# Patient Record
Sex: Male | Born: 1955
Health system: Southern US, Community
[De-identification: ages and names within clinical notes are randomized; demographics above are authoritative.]

## PROBLEM LIST (undated history)

## (undated) DIAGNOSIS — N289 Disorder of kidney and ureter, unspecified: Secondary | ICD-10-CM

## (undated) DIAGNOSIS — I10 Essential (primary) hypertension: Secondary | ICD-10-CM

## (undated) DIAGNOSIS — K429 Umbilical hernia without obstruction or gangrene: Secondary | ICD-10-CM

## (undated) HISTORY — DX: Umbilical hernia without obstruction or gangrene: K42.9

---

## 1996-03-20 HISTORY — PX: COLONOSCOPY: SHX174

## 1997-04-20 HISTORY — PX: HERNIA REPAIR: SHX51

## 2002-02-05 ENCOUNTER — Emergency Department (HOSPITAL_COMMUNITY): Admission: EM | Admit: 2002-02-05 | Discharge: 2002-02-05 | Payer: Self-pay | Admitting: Emergency Medicine

## 2006-07-22 ENCOUNTER — Ambulatory Visit: Payer: Self-pay | Admitting: Family Medicine

## 2006-07-22 ENCOUNTER — Observation Stay (HOSPITAL_COMMUNITY): Admission: EM | Admit: 2006-07-22 | Discharge: 2006-07-23 | Payer: Self-pay | Admitting: Emergency Medicine

## 2009-02-15 ENCOUNTER — Encounter: Payer: Self-pay | Admitting: Gastroenterology

## 2009-04-14 ENCOUNTER — Emergency Department (HOSPITAL_COMMUNITY): Admission: EM | Admit: 2009-04-14 | Discharge: 2009-04-14 | Payer: Self-pay | Admitting: Emergency Medicine

## 2009-06-03 ENCOUNTER — Encounter (INDEPENDENT_AMBULATORY_CARE_PROVIDER_SITE_OTHER): Payer: Self-pay | Admitting: *Deleted

## 2009-07-01 ENCOUNTER — Encounter (INDEPENDENT_AMBULATORY_CARE_PROVIDER_SITE_OTHER): Payer: Self-pay | Admitting: *Deleted

## 2009-07-02 ENCOUNTER — Ambulatory Visit: Payer: Self-pay | Admitting: Gastroenterology

## 2009-07-08 ENCOUNTER — Telehealth: Payer: Self-pay | Admitting: Gastroenterology

## 2010-05-20 NOTE — Progress Notes (Signed)
Summary: cx proc for tomorrow  Phone Note Call from Patient Call back at Home Phone [phone]   Caller: Spouse Call For: Arlyce Dice Reason for Call: Talk to Nurse Summary of Call: Patient cancelled his procedure for tomorrow because his dad was put in the ICU today and he is there with his father, wife states that he will call and reschedule that appt for a later time.   Are you going to charge?  Initial call taken by: Tawni Levy,  July 08, 2009 3:30 PM  Follow-up for Phone Call        no Follow-up by: Louis Meckel MD,  July 08, 2009 4:35 PM     Appended Document: cx proc for tomorrow Patient NOT BILLED.

## 2010-05-20 NOTE — Letter (Signed)
Summary: [facility] Instructions  [facility]  [address] [place], [place] [address]   Phone: [phone]  Fax: [phone]       AZEL GUMINA    1955/06/08    MRN: [id number]        Procedure Day Dorna Bloom:  Jake Shark  07/09/09     Arrival Time:  10:30AM     Procedure Time:  11:30AM     Location of Procedure:                    _ X_  [facility] (4th Floor)                        PREPARATION FOR COLONOSCOPY WITH MOVIPREP   Starting 5 days prior to your procedure 07/04/09 do not eat nuts, seeds, popcorn, corn, beans, peas,  salads, or any raw vegetables.  Do not take any fiber supplements (e.g. Metamucil, Citrucel, and Benefiber).  THE DAY BEFORE YOUR PROCEDURE         DATE: 07/08/09  DAY: MONDAY  1.  Drink clear liquids the entire day-NO SOLID FOOD  2.  Do not drink anything colored red or purple.  Avoid juices with pulp.  No orange juice.  3.  Drink at least 64 oz. (8 glasses) of fluid/clear liquids during the day to prevent dehydration and help the prep work efficiently.  CLEAR LIQUIDS INCLUDE: Water Jello Ice Popsicles Tea (sugar ok, no milk/cream) Powdered fruit flavored drinks Coffee (sugar ok, no milk/cream) Gatorade Juice: apple, white grape, white cranberry  Lemonade Clear bullion, consomm, broth Carbonated beverages (any kind) Strained chicken noodle soup Hard Candy                             4.  In the morning, mix first dose of MoviPrep solution:    Empty 1 Pouch A and 1 Pouch B into the disposable container    Add lukewarm drinking water to the top line of the container. Mix to dissolve    Refrigerate (mixed solution should be used within 24 hrs)  5.  Begin drinking the prep at 5:00 p.m. The MoviPrep container is divided by 4 marks.   Every 15 minutes drink the solution down to the next mark (approximately 8 oz) until the full liter is complete.   6.  Follow completed prep with 16 oz of clear liquid of your choice  (Nothing red or purple).  Continue to drink clear liquids until bedtime.  7.  Before going to bed, mix second dose of MoviPrep solution:    Empty 1 Pouch A and 1 Pouch B into the disposable container    Add lukewarm drinking water to the top line of the container. Mix to dissolve    Refrigerate  THE DAY OF YOUR PROCEDURE      DATE: 07/09/09  DAY: TUESDAY  Beginning at 6:30AM (5 hours before procedure):         1. Every 15 minutes, drink the solution down to the next mark (approx 8 oz) until the full liter is complete.  2. Follow completed prep with 16 oz. of clear liquid of your choice.    3. You may drink clear liquids until 9:30AM (2 HOURS BEFORE PROCEDURE).   MEDICATION INSTRUCTIONS  Unless otherwise instructed, you should take regular prescription medications with a small sip of water   as early as possible the morning  of your procedure.         OTHER INSTRUCTIONS  You will need a responsible adult at least 56 years of age to accompany you and drive you home.   This person must remain in the waiting room during your procedure.  Wear loose fitting clothing that is easily removed.  Leave jewelry and other valuables at home.  However, you may wish to bring a book to read or  an iPod/MP3 player to listen to music as you wait for your procedure to start.  Remove all body piercing jewelry and leave at home.  Total time from sign-in until discharge is approximately 2-3 hours.  You should go home directly after your procedure and rest.  You can resume normal activities the  day after your procedure.  The day of your procedure you should not:   Drive   Make legal decisions   Operate machinery   Drink alcohol   Return to work  You will receive specific instructions about eating, activities and medications before you leave.    The above instructions have been reviewed and explained to me by   Wyona Almas RN  July 02, 2009 8:57 AM     I fully understand  and can verbalize these instructions _____________________________ Date _________

## 2010-05-20 NOTE — Letter (Signed)
Summary: Previsit letter  [facility]  [address] [place], [place] [address]   Phone: [phone]  Fax: [phone]       06/03/2009 MRN: [id number]  Phillip Mccall [address], [place]  [address]  Dear Phillip Mccall,  Welcome to the Gastroenterology Division at [facility].    You are scheduled to see a nurse for your pre-procedure visit on 07/02/2009 at 8:30AM on the 3rd floor at [facility], [address]  We ask that you try to arrive at our office 15 minutes prior to your appointment time to allow for check-in.  Your nurse visit will consist of discussing your medical and surgical history, your immediate family medical history, and your medications.    Please bring a complete list of all your medications or, if you prefer, bring the medication bottles and we will list them.  We will need to be aware of both prescribed and over the counter drugs.  We will need to know exact dosage information as well.  If you are on blood thinners (Coumadin, Plavix, Aggrenox, Ticlid, etc.) please call our office today/prior to your appointment, as we need to consult with your physician about holding your medication.   Please be prepared to read and sign documents such as consent forms, a financial agreement, and acknowledgement forms.  If necessary, and with your consent, a friend or relative is welcome to sit-in on the nurse visit with you.  Please bring your insurance card so that we may make a copy of it.  If your insurance requires a referral to see a specialist, please bring your referral form from your primary care physician.  No co-pay is required for this nurse visit.     If you cannot keep your appointment, please call [phone] to cancel or reschedule prior to your appointment date.  This allows [place] the opportunity to schedule an appointment for another patient in need of care.    Thank you for choosing [facility] for your medical needs.   We appreciate the opportunity to care for you.  Please visit [place] at our website  to learn more about our practice.                     Sincerely.                                                                                                                   The Gastroenterology Division

## 2010-05-20 NOTE — Miscellaneous (Signed)
Summary: [facility] Previsit/prep  Clinical Lists Changes  Medications: Added new medication of MOVIPREP 100 GM  SOLR (PEG-KCL-NACL-NASULF-NA ASC-C) As per prep instructions. - Signed Rx of MOVIPREP 100 GM  SOLR (PEG-KCL-NACL-NASULF-NA ASC-C) As per prep instructions.;  #1 x 0;  Signed;  Entered by: Wyona Almas RN;  Authorized by: Louis Meckel MD;  Method used: Electronically to [facility] [address], [place], [place], [place]  [address], Ph: [phone], Fax: [phone] Allergies: Added new allergy or adverse reaction of SULFA Observations: Added new observation of NKA: F (07/02/2009 8:26)    Prescriptions: MOVIPREP 100 GM  SOLR (PEG-KCL-NACL-NASULF-NA ASC-C) As per prep instructions.  #1 x 0   Entered by:   Wyona Almas RN   Authorized by:   Louis Meckel MD   Signed by:   Wyona Almas RN on 07/02/2009   Method used:   Electronically to        [facility] (retail)       [address]       [place], [place]  [address]       Ph: [phone]       Fax: [phone]   RxID:   [id number]

## 2010-07-21 LAB — URIC ACID: Uric Acid, Serum: 7.9 mg/dL — ABNORMAL HIGH (ref 4.0–7.8)

## 2010-07-21 LAB — CBC
HCT: 42.8 % (ref 39.0–52.0)
Platelets: 266 10*3/uL (ref 150–400)
RBC: 5.04 MIL/uL (ref 4.22–5.81)
WBC: 9.5 10*3/uL (ref 4.0–10.5)

## 2010-07-21 LAB — DIFFERENTIAL
Lymphocytes Relative: 14 % (ref 12–46)
Lymphs Abs: 1.3 10*3/uL (ref 0.7–4.0)
Monocytes Relative: 6 % (ref 3–12)
Neutrophils Relative %: 78 % — ABNORMAL HIGH (ref 43–77)

## 2010-09-05 NOTE — H&P (Signed)
NAMEKARLOS, SCADDEN              ACCOUNT NO.:  [id number]   MEDICAL RECORD NO.:  [id number]          PATIENT TYPE:  INP   LOCATION:  3710                         FACILITY:  [facility]   PHYSICIAN:  Leighton Roach McDiarmid, M.D.DATE OF BIRTH:  [birth year]   DATE OF ADMISSION:  07/22/2006  DATE OF DISCHARGE:  07/23/2006                              HISTORY & PHYSICAL   PRIMARY CARE PHYSICIAN:  Dr. John Giovanni of [place], [place].   CHIEF COMPLAINT:  Chest pain.   HPI:  This is a 55 year old white male with a history of hypertension  and a former tobacco abuser.  He started this morning with little sharp  chest pains in the middle of his chest.  Later that day he had  squeezing left-sided chest pain that lasted a couple of seconds.  He  then noted left-sided facial pain that lasted 2 seconds and disoriented  him.  He then admits he became very worried and his heart rate shot up.  He even states, I think I may have had a panic attack.  He was  breathing fast for about 15 minutes and his hands went clammy.  He  denies any associated diaphoresis, nausea, vomiting, numbness, tingling,  syncope or loss of consciousness.   REVIEW OF SYSTEMS:  GENERAL:  No fever.  CARDIOVASCULAR:  No  palpitations.  Positive for chest pain.  PULMONARY:  No shortness of  breath.  No increased work of breathing.  GI:  No nausea.  No vomiting.  No diarrhea.  No reflux symptoms.  NEURO:  No seizure.  No loss of  consciousness.  No confusion.  No altered mental status.  No numbness or  tingling.   PAST MEDICAL HISTORY:  The patient has a history of:  1. Hypertension, but is on no medicines.  2. He has a history of a kidney stone, as well as a right inguinal      hernia repair.  3. He also reports internal hemorrhoids.   MEDICINES:  Aspirin 81 mg p.o. daily.   ALLERGIES:  NONE.   SOCIAL HISTORY:  The patient is married.  He is the [address] [employer]  in [place] and he also works on a farm  part-time.  He has  smoked off and on for many years and quit on New Year's Day, January of  2008.  He admits to an occasional glass of wine, but denies any illicit  drugs.   FAMILY HISTORY:  His grandfather died from a myocardial infarction.  His  father has hypertension, but no known coronary artery disease.  His  mother has Parkinson and his daughter has type 1 diabetes.   VITAL SIGNS:  Temperature 98.7.  Blood pressure 157/92.  Heart rate 120.  Respirations 20, 97% on room air.  GENERAL:  No acute distress and cooperative.  HEENT:  Pupils are equal, round and reactive to light.  Sclerae are  anicteric.  Mucous membranes are moist and pharynx is nonerythematous.  CARDIOVASCULAR:  Tachycardic with regular rate and rhythm.  No murmur  and no JVD.  LUNGS:  Normal effort.  Clear  to auscultation bilaterally.  No wheezes  or crackles.  ABDOMEN:  Obese.  Active bowel sounds.  Soft, nontender, nondistended.  No masses.  EXTREMITIES:  No edema.  CHEST:  There is no tenderness to palpation at the costal rib margins.   STUDIES:  A chest x-ray shows no acute disease with questionable COPD  versus emphysema.  EKG shows sinus tachycardia with no acute ST segment  or T wave changes.   LABS:  D-dimer is less than 0.22.  Sodium is 139, potassium 4.4,  chloride 108, bicarb 23.8, creatinine 1.1, glucose 118.  Initial  troponin is less than 0.05.   ASSESSMENT:  1. This is a 55 year old white male with atypical chest pain.  The      chest pain is likely noncardiac; however, he does have a number of      cardiac risk factors including male gender, age greater than 15,      history of tobacco use and hypertension.  We will obtain 3 sets of      cardiac enzymes in order to rule out acute myocardial infarction,      as well as obtain another EKG in the morning to assure acute      findings.  We will also obtain a fasting lipid panel in the morning      to assess for any underlying hyperlipidemia.   We will start a      proton pump inhibitor that ensure that his chest pain is not      secondary to reflux disease.  There is no evidence for      costochondritis on exam.  We will prescribe Ativan as needed for      anxiety as there is definitely an underlying anxiety component      related to his chest pain.  We will hold off for now on calling      cardiology, unless the clinical suspicion for myocardial infarction      increases.  2. Hypertension:  The patient has a history of hypertension, but is on      no oral meds.  We will start metoprolol 25 mg p.o. b.i.d. given his      increased heart rate on exam.     ______________________________  Sylvan Cheese, M.D.    ______________________________  Leighton Roach McDiarmid, M.D.    MJ/MEDQ  D:  07/23/2006  T:  07/24/2006  Job:  [id number]

## 2010-09-05 NOTE — Discharge Summary (Signed)
NAMEBRANDAN, Phillip Mccall NO.:  [id number]   MEDICAL RECORD NO.:  [id number]          PATIENT TYPE:  INP   LOCATION:  3710                         FACILITY:  [facility]   PHYSICIAN:  Sylvan Cheese, M.D.       DATE OF BIRTH:  06-25-1955   DATE OF ADMISSION:  07/22/2006  DATE OF DISCHARGE:  07/23/2006                               DISCHARGE SUMMARY   PRIMARY CARE PHYSICIAN:  Dr. John Giovanni in [place], [facility].   DISCHARGE DIAGNOSES:  1. Atypical chest pain.  2. Anxiety disorder.  3. Hypertension.  4. Hyperlipidemia.  5. History of former tobacco abuse.   CONSULTS:  None.   PROCEDURES:  1. A chest x-ray was obtained that showed no acute disease and was      questionable for underlying emphysema versus COPD.  2. An EKG was also performed that initially showed sinus tachycardia      with no acute ST segment or T wave changes.   DISCHARGE MEDICATIONS:  1. Metoprolol 12.5 mg p.o. b.i.d.  2. Zocor 40 mg p.o. each night.  3. Aspirin 81 mg p.o. daily.   FOLLOWUP INSTRUCTIONS:  The patient is to make an followup appointment  with Dr. Sudie Bailey within the next week for followup.  The patient's wife  will also establish care with a primary cardiologist in the area, which  is to be determined.   HOSPITAL COURSE:  Briefly, Phillip Mccall is a 55 year old white male with  a known history of hypertension, but on no medications.  He presented to  the emergency department with some left squeezing chest pain earlier in  the day that lasted approximately 2 minutes and was resolved by his  presentation to the emergency department.  Given his multiple risk  factors, including smoking, hypertension, male gender, age greater than  4 and history of tobacco abuse, it was pertinent to bring him in to  rule out cardiac causes of chest pain.  An EKG, on admission, showed no  acute changes.  A chest x-ray was negative.  He was ruled out with  cardiac markers x3 every 8 hours.   Of note, a fasting lipid panel was  significant for an HDL of 35 and an LDL of 172.  Therefore, he does have  some significant hyperlipidemia, as his goal LDL is 130.  He was started  on metoprolol for an increased heart rate, as well as elevated blood  pressures.  He was also started on Zocor this admission and is agreeable  to lipid therapy in the future.  The risks of metoprolol and Zocor were  discussed with the patient at length.  At the time of discharge, he  still complained of some vague, brief left-sided chest pain that lasted  a couple of seconds, which he currently attributes to gastroesophageal  reflux.  The patient, on the day of discharge, did not desire evaluation  by a cardiologist and was anxious to leave the hospital.  It was  reinforced to the patient that a cardiac cause of his chest discomfort  could not be ruled out without thorough  evaluation by a cardiologist.  He understands these risks and was discharged from the  hospital.  There is a high suspicion that his chest pain, however, is  probably due to some underlying anxiety regarding coronary artery  disease in his family members.  He does endorse that he does have  anxiety, that heart rate goes up when he feels his chest discomfort.           ______________________________  Sylvan Cheese, M.D.     MJ/MEDQ  D:  07/23/2006  T:  07/24/2006  Job:  [id number]   cc:   Mila Homer. Sudie Bailey, M.D.

## 2014-08-17 ENCOUNTER — Emergency Department (HOSPITAL_COMMUNITY)
Admission: EM | Admit: 2014-08-17 | Discharge: 2014-08-17 | Disposition: A | Discharge status: Home / Self Care | Payer: 59 | Attending: Emergency Medicine | Admitting: Emergency Medicine

## 2014-08-17 ENCOUNTER — Emergency Department (HOSPITAL_COMMUNITY): Payer: 59

## 2014-08-17 ENCOUNTER — Encounter (HOSPITAL_COMMUNITY): Payer: Self-pay | Admitting: *Deleted

## 2014-08-17 DIAGNOSIS — S39012A Strain of muscle, fascia and tendon of lower back, initial encounter: Secondary | ICD-10-CM | POA: Insufficient documentation

## 2014-08-17 DIAGNOSIS — Y92410 Unspecified street and highway as the place of occurrence of the external cause: Secondary | ICD-10-CM | POA: Insufficient documentation

## 2014-08-17 DIAGNOSIS — Z87442 Personal history of urinary calculi: Secondary | ICD-10-CM | POA: Insufficient documentation

## 2014-08-17 DIAGNOSIS — S161XXA Strain of muscle, fascia and tendon at neck level, initial encounter: Secondary | ICD-10-CM | POA: Insufficient documentation

## 2014-08-17 DIAGNOSIS — I10 Essential (primary) hypertension: Secondary | ICD-10-CM | POA: Insufficient documentation

## 2014-08-17 DIAGNOSIS — Y998 Other external cause status: Secondary | ICD-10-CM | POA: Insufficient documentation

## 2014-08-17 DIAGNOSIS — Y9389 Activity, other specified: Secondary | ICD-10-CM | POA: Insufficient documentation

## 2014-08-17 DIAGNOSIS — Z87448 Personal history of other diseases of urinary system: Secondary | ICD-10-CM | POA: Insufficient documentation

## 2014-08-17 DIAGNOSIS — T148XXA Other injury of unspecified body region, initial encounter: Secondary | ICD-10-CM

## 2014-08-17 HISTORY — DX: Disorder of kidney and ureter, unspecified: N28.9

## 2014-08-17 HISTORY — DX: Essential (primary) hypertension: I10

## 2014-08-17 MED ORDER — HYDROCODONE-ACETAMINOPHEN 5-325 MG PO TABS: ORAL_TABLET | ORAL | Status: DC

## 2014-08-17 MED ORDER — METHOCARBAMOL 500 MG PO TABS: 1000.0000 mg | ORAL_TABLET | Freq: Four times a day (QID) | ORAL | Status: DC | PRN

## 2014-08-17 MED ORDER — ACETAMINOPHEN 500 MG PO TABS
1000.0000 mg | ORAL_TABLET | Freq: Once | ORAL | Status: DC
  Filled 2014-08-17: qty 2

## 2014-08-17 MED ORDER — IBUPROFEN 400 MG PO TABS
400.0000 mg | ORAL_TABLET | Freq: Once | ORAL | Status: DC
  Filled 2014-08-17: qty 1
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
## 2014-08-17 NOTE — ED Notes (Signed)
Patient stated that his increase pulse rate was due to b/p cuff.  Cuff removed and pulse rate dropped to 107

## 2014-08-17 NOTE — Discharge Instructions (Signed)
°Emergency Department Resource Guide °1) Find a Doctor and Pay Out of Pocket °Although you won't have to find out who is covered by your insurance plan, it is a good idea to ask around and get recommendations. You will then need to call the office and see if the doctor you have chosen will accept you as a new patient and what types of options they offer for patients who are self-pay. Some doctors offer discounts or will set up payment plans for their patients who do not have insurance, but you will need to ask so you aren't surprised when you get to your appointment. ° °2) Contact Your Local Health Department °Not all health departments have doctors that can see patients for sick visits, but many do, so it is worth a call to see if yours does. If you don't know where your local health department is, you can check in your phone book. The CDC also has a tool to help you locate your state's health department, and many state websites also have listings of all of their local health departments. ° °3) Find a Walk-in Clinic °If your illness is not likely to be very severe or complicated, you may want to try a walk in clinic. These are popping up all over the country in pharmacies, drugstores, and shopping centers. They're usually staffed by nurse practitioners or physician assistants that have been trained to treat common illnesses and complaints. They're usually fairly quick and inexpensive. However, if you have serious medical issues or chronic medical problems, these are probably not your best option. ° °No Primary Care Doctor: °- Call [facility] at  832-8000 - they can help you locate a primary care doctor that  accepts your insurance, provides certain services, etc. °- Physician Referral Service- 1-800-533-3463 ° °Chronic Pain Problems: °Organization         Address  Phone   Notes  °[facility]  ([phone]) [phone] Patients need to be referred by their primary care doctor.  ° °Medication  Assistance: °Organization         Address  Phone   Notes  °[place] [facility] [address] °[place], [place] [address] (336) 641-8030 --Must be a resident of [place] °-- Must have NO insurance coverage whatsoever (no Medicaid/ Medicare, etc.) °-- The pt. MUST have a primary care doctor that directs their care regularly and follows them in the community °  °[facility]  (866) 331-1348   °[employer]  (888) 892-1162   ° °Agencies that provide inexpensive medical care: °Organization         Address  Phone   Notes  °[facility]  (336) 832-8035   °[facility]    (336) 832-7272   °[facility] [address] °[place], [place] [address] (336) 832-4777   °[facility] [address], °[place] (336) 271-4999   °[facility]    (336) 373-0678   °[facility]    (336) 272-1050   °[facility] ° [address], [place] Phone:  (336) 832-4444, Fax:  (336) 832-4440 Hours of Operation:  9 am - 6 pm, M-F.  Also accepts Medicaid/Medicare and self-pay.  °[facility] ° [address], [place] Phone: (336) [phone], Fax: (336) [phone]. Hours of Operation:  8:30 am - 5:30 pm, M-F.  Also accepts Medicaid and self-pay.  °[facility] [place] [address], [place] Phone: (336) 878-6027   °[facility] [address], [place], [place] (336)723-1848, Ext. 123 Mondays & Thursdays: 7-9 AM.  First 15 patients are seen on a first come, first serve basis. °  ° °Medicaid-accepting [place] Providers: ° °Organization         Address  Phone   Notes  °[facility] [address], [place] (336) 641-2100 Also accepts self-pay patients.  °[facility] [address], [place] ° (336) 856-9996   °[facility] [address], [place]  (336) 288-8857   °[facility] [address] [place] [address], [place] (336) 299-7000   °Veita Bland [address], [place]  ° (336) 373-1557 Only accepts [place] Medicaid patients after they have their name applied to their card.  ° °Self-Pay (no insurance) in [place]: ° °Organization         Address  Phone   Notes  °Sickle Cell Patients, [facility] [address], [place] (336) 832-1970   °[facility] [address], [place] (336) 832-4400   °[facility] ° [address], [place] (336) 992-4800   °[facility]/Dr. Osei-Bonsu ° [address] [place] [address], [place] or [address], [place] (336) [phone] Phone number for both [place] and [place] locations is the same.  °[facility] [address], [place] (336) 299-0000   °[facility] [address] [place] [address], [place] or [address] (336) 852-7530 °(336) 878-2260   °[facility] [address], [place] (336) 350-1642, phone; (336) 294-5005, fax Sees patients 1st and 3rd Saturday of every month.  Must not qualify for public or private insurance (i.e. Medicaid, Medicare, [place] Health Choice, Veterans' Benefits) • Household income should be no more than 200% of the poverty level •The clinic cannot treat you if you are pregnant or think you are pregnant • Sexually transmitted diseases are not treated at the clinic.  ° ° °Dental Care: °Organization         Address  Phone  Notes  °[place] [facility] [address],  (336) 641-6152 Accepts children up to age 21 who are enrolled in Medicaid or [place] Health Choice; pregnant women with a Medicaid card; and children who have applied for Medicaid or [place] Health Choice, but were declined, whose parents can pay a reduced fee at time of service.  °[place]  [facility] [place]  [address], [place] (336) 641-7733 Accepts children up to age 21 who are enrolled in Medicaid or [place] Health Choice; pregnant women with a Medicaid card; and children who have applied for Medicaid or [place] Health Choice, but were declined, whose parents can pay a reduced fee at time of service.  °[facility] ° [address],  (336) 641-4533 Patients are seen by appointment only. Walk-ins are not accepted. [facility] will see patients 18 years of age and older. °Monday - Tuesday (8am-5pm) °Most Wednesdays (8:30-5pm) °$30 per visit, cash only  °[facility] ° [address], [place] (336) 641-4533 Patients are seen by appointment only. Walk-ins are not accepted. [facility] will see patients 18 years of age and older. °One   Wednesday Evening (Monthly: Volunteer Based).  $30 per visit, cash only  °[facility]  (919) 537-3737 for adults; Children under age 4, call Graduate Pediatric Dentistry at ([phone]) [phone]. Children aged 4-14, please call ([phone]) [phone] to request a pediatric application. ° Dental services are provided in all areas of dental care including fillings, crowns and bridges, complete and partial dentures, implants, gum treatment, root canals, and extractions. Preventive care is also provided. Treatment is provided to both adults and children. °Patients are selected via a lottery and there is often a waiting list. °  °[facility] [address], °[place] ° (336) 763-8833 [email or web address] °  °[facility] [address], [place], [place] (336)723-1848, Ext. 123 Second and Fourth Thursday of each month, opens at 6:30 AM; Clinic ends at 9 AM.  Patients are seen on a first-come first-served basis, and a limited number are seen during each clinic.  ° °[facility] ° [address], [place], [place] (336) [phone]    Eligibility Requirements °You must have lived in [place], [place], or [place] counties for at least the last three months. °  You cannot be eligible for state or federal sponsored healthcare insurance, including Veterans Administration, Medicaid, or Medicare. °  You generally cannot be eligible for healthcare insurance through your employer.  °  How to apply: °Eligibility screenings are held every Tuesday and Wednesday afternoon from 1:00 pm until 4:00 pm. You do not need an appointment for the interview!  °[facility] [address], [place], [place] 336-631-2330   °[facility]  336-342-8273   °[facility]  336-703-3100   °[facility]  336-570-6415   ° °Behavioral Health Resources in the Community: °Intensive Outpatient Programs °Organization         Address  Phone  Notes  °[facility] [address], [place], [place] 336-878-6098   °[facility] [address], [place], [place] 336-832-9800   °[facility] [address], [place], [place] ° 336-882-2125   °[facility] [address],  °[place], [place] 1-800-853-5163 or 336-641-4981   °Substance Abuse Resources °Organization         Address  Phone  Notes  °[facility]  336-882-2125   °[facility]  336-784-9470   °[facility]  336-285-9073   °[facility]  336-845-3988   °Residential & Outpatient Substance Abuse Program  1-800-659-3381   °Psychological Services °Organization         Address  Phone  Notes  °[facility]  336- 832-9600   °[facility]  336- 378-7881   °[facility] [address], [place] 1-800-853-5163 or 336-641-4981   ° °Mobile Crisis Teams °Organization         Address  Phone  Notes  °[facility], Mobile Crisis Care Unit  1-877-626-1772   °Assertive °[facility] ° [address]  [place], [place] 336-834-9664   °Sharon DeEsch [address] °[place] 336-554-5454   ° °Self-Help/Support Groups °Organization         Address  Phone             Notes  °[facility]  - variety of support groups  336- 373-1402 Call for more information  °Narcotics Anonymous (NA), [facility] [address], °[place]  2 meetings at this location  ° °  Residential Treatment Programs Organization         Address  Phone  Notes  [facility] [address],    [place]  1-8577614854   [facility]  [address] [place] [address], [place], [place]   [place], [place] 320-614-6729 Admissions: 8am-3pm M-F  [facility] [place] [address],    [place], [place] 409-735-3299   [facility] [address] [place], [place], [place]   [facility] [address],  [place], [place]   [facility] Intensive Outpatient [address], [place], [place]   [facility] [address]  [place], [place] 1-681-038-0179 or (304) 532-8645   [facility] [address] [place], [place] Accepts Medicaid  [facility] [address]  [place] 1-845-151-4532 Substance Abuse/Addiction Treatment   [facility]         Address  Phone  Notes  [facility]  2043886244   Domenic Schwab, PhD [address] [place], [place]   (914)736-7845 or 424-647-1198   [place], [place] 514-415-9623   [facility] [address] [place], [place] (563) [phone] Insurance/Medicaid/sponsorship through [facility] and Families [address]                                    [place], [place] [phone])[phone] [place] [address], [place] [phone]    Dr. Adele Schilder  [phone]   [facility] Dept. 1) [address] [place] 2) [place] 3)  [place] [address], [place] [phone]  [phone]   [place] 925-725-2407 or [phone] (After Hours)      Take the prescriptions as directed. Do not take extra tylenol if you take the prescription pain medication (norco) because it already has tylenol in it. Apply moist heat or ice to the area(s) of discomfort, for 15 minutes at a time, several times per day for the next few days.  Do not fall asleep on a heating or ice pack.  Call your regular medical doctor on Monday to schedule a follow up appointment in the next 3 days.  Return to the Emergency Department immediately if worsening.
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
## 2014-10-03 ENCOUNTER — Encounter: Payer: Self-pay | Admitting: Internal Medicine

## 2015-05-13 MED FILL — ALPRAZolam 1 MG TABS: 1 | 30 days supply | Qty: 90 | Fill #0

## 2015-06-06 DIAGNOSIS — E6609 Other obesity due to excess calories: Secondary | ICD-10-CM | POA: Diagnosis not present

## 2015-06-06 DIAGNOSIS — I10 Essential (primary) hypertension: Secondary | ICD-10-CM | POA: Diagnosis not present

## 2015-06-06 DIAGNOSIS — Z0001 Encounter for general adult medical examination with abnormal findings: Secondary | ICD-10-CM | POA: Diagnosis not present

## 2015-06-06 DIAGNOSIS — Z125 Encounter for screening for malignant neoplasm of prostate: Secondary | ICD-10-CM | POA: Diagnosis not present

## 2015-06-12 MED FILL — ALPRAZolam 1 MG TABS: 1 | 30 days supply | Qty: 90 | Fill #1

## 2015-07-02 DIAGNOSIS — I10 Essential (primary) hypertension: Secondary | ICD-10-CM | POA: Diagnosis not present

## 2015-07-02 DIAGNOSIS — M109 Gout, unspecified: Secondary | ICD-10-CM | POA: Diagnosis not present

## 2015-07-02 DIAGNOSIS — E6609 Other obesity due to excess calories: Secondary | ICD-10-CM | POA: Diagnosis not present

## 2015-07-02 DIAGNOSIS — F419 Anxiety disorder, unspecified: Secondary | ICD-10-CM | POA: Diagnosis not present

## 2015-07-15 MED FILL — ALPRAZolam 1 MG TABS: 1 | 30 days supply | Qty: 90 | Fill #2

## 2015-08-15 MED FILL — ALPRAZolam 1 MG TABS: 1 | 30 days supply | Qty: 90 | Fill #3

## 2015-09-17 MED FILL — ALPRAZolam 1 MG TABS: 1 | 30 days supply | Qty: 90 | Fill #4

## 2015-10-18 MED FILL — ALPRAZolam 1 MG TABS: 1 | 30 days supply | Qty: 90 | Fill #5

## 2015-10-29 DIAGNOSIS — F419 Anxiety disorder, unspecified: Secondary | ICD-10-CM | POA: Diagnosis not present

## 2015-10-29 DIAGNOSIS — I10 Essential (primary) hypertension: Secondary | ICD-10-CM | POA: Diagnosis not present

## 2015-10-29 DIAGNOSIS — M109 Gout, unspecified: Secondary | ICD-10-CM | POA: Diagnosis not present

## 2015-10-29 DIAGNOSIS — E6609 Other obesity due to excess calories: Secondary | ICD-10-CM | POA: Diagnosis not present

## 2015-11-26 MED FILL — ALPRAZolam 1 MG TABS: 1 | 30 days supply | Qty: 90 | Fill #0
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
## 2015-12-30 MED FILL — ALPRAZolam 1 MG TABS: 1 | 30 days supply | Qty: 90 | Fill #1

## 2016-01-30 MED FILL — ALPRAZolam 1 MG TABS: 1 | 30 days supply | Qty: 90 | Fill #2

## 2016-03-02 MED FILL — ALPRAZolam 1 MG TABS: 1 | 30 days supply | Qty: 90 | Fill #3

## 2016-04-01 MED FILL — ALPRAZolam 1 MG TABS: 1 | 30 days supply | Qty: 90 | Fill #4

## 2016-05-19 DIAGNOSIS — M25511 Pain in right shoulder: Secondary | ICD-10-CM | POA: Diagnosis not present

## 2016-05-19 DIAGNOSIS — Z0001 Encounter for general adult medical examination with abnormal findings: Secondary | ICD-10-CM | POA: Diagnosis not present

## 2016-05-19 DIAGNOSIS — F419 Anxiety disorder, unspecified: Secondary | ICD-10-CM | POA: Diagnosis not present

## 2016-05-19 DIAGNOSIS — E6609 Other obesity due to excess calories: Secondary | ICD-10-CM | POA: Diagnosis not present

## 2016-05-19 DIAGNOSIS — E7800 Pure hypercholesterolemia, unspecified: Secondary | ICD-10-CM | POA: Diagnosis not present

## 2016-05-19 MED FILL — ALPRAZolam 1 MG TABS: 1 | 30 days supply | Qty: 90 | Fill #0

## 2016-05-27 DIAGNOSIS — E7800 Pure hypercholesterolemia, unspecified: Secondary | ICD-10-CM | POA: Diagnosis not present

## 2016-05-27 DIAGNOSIS — E6609 Other obesity due to excess calories: Secondary | ICD-10-CM | POA: Diagnosis not present

## 2016-05-27 DIAGNOSIS — R739 Hyperglycemia, unspecified: Secondary | ICD-10-CM | POA: Diagnosis not present

## 2016-05-27 DIAGNOSIS — I10 Essential (primary) hypertension: Secondary | ICD-10-CM | POA: Diagnosis not present

## 2016-06-01 DIAGNOSIS — E6609 Other obesity due to excess calories: Secondary | ICD-10-CM | POA: Diagnosis not present

## 2016-06-01 DIAGNOSIS — F419 Anxiety disorder, unspecified: Secondary | ICD-10-CM | POA: Diagnosis not present

## 2016-06-01 DIAGNOSIS — I10 Essential (primary) hypertension: Secondary | ICD-10-CM | POA: Diagnosis not present

## 2016-06-18 MED FILL — ALPRAZolam 1 MG TABS: 1 | 30 days supply | Qty: 90 | Fill #1

## 2016-07-10 DIAGNOSIS — I10 Essential (primary) hypertension: Secondary | ICD-10-CM | POA: Diagnosis not present

## 2016-07-10 DIAGNOSIS — M25511 Pain in right shoulder: Secondary | ICD-10-CM | POA: Diagnosis not present

## 2016-07-10 DIAGNOSIS — F419 Anxiety disorder, unspecified: Secondary | ICD-10-CM | POA: Diagnosis not present

## 2016-07-16 MED FILL — ALPRAZolam 1 MG TABS: 1 | 30 days supply | Qty: 90 | Fill #2

## 2016-08-17 MED FILL — ALPRAZolam 1 MG TABS: 1 | 30 days supply | Qty: 90 | Fill #3

## 2016-09-16 MED FILL — ALPRAZolam 1 MG TABS: 1 | 30 days supply | Qty: 90 | Fill #4

## 2016-10-15 MED FILL — ALPRAZolam 1 MG TABS: 1 | 30 days supply | Qty: 90 | Fill #5

## 2017-05-20 MED FILL — COLCHICINE 0.6 MG TABS: 0.6 | 10 days supply | Qty: 30 | Fill #0

## 2017-05-24 DIAGNOSIS — N529 Male erectile dysfunction, unspecified: Secondary | ICD-10-CM | POA: Diagnosis not present

## 2017-05-24 DIAGNOSIS — E7800 Pure hypercholesterolemia, unspecified: Secondary | ICD-10-CM | POA: Diagnosis not present

## 2017-05-24 DIAGNOSIS — I10 Essential (primary) hypertension: Secondary | ICD-10-CM | POA: Diagnosis not present

## 2017-05-24 DIAGNOSIS — Z0001 Encounter for general adult medical examination with abnormal findings: Secondary | ICD-10-CM | POA: Diagnosis not present

## 2017-05-27 MED FILL — COLCHICINE 0.6 MG TABS: 0.6 | 10 days supply | Qty: 30 | Fill #0

## 2017-09-29 MED FILL — ESCITALOPRAM 10 MG TABLET: 10 | 90 days supply | Qty: 90 | Fill #0

## 2018-01-07 MED FILL — ESCITALOPRAM 10 MG TABLET: 10 | 60 days supply | Qty: 60 | Fill #1

## 2018-03-09 MED FILL — ESCITALOPRAM 10 MG TABLET: 10 | 90 days supply | Qty: 90 | Fill #0

## 2018-06-13 MED FILL — ESCITALOPRAM 10 MG TABLET: 10 | 90 days supply | Qty: 90 | Fill #1 | Status: TO

## 2018-09-14 MED FILL — ESCITALOPRAM 10 MG TABLET: 10 | 90 days supply | Qty: 90 | Fill #0

## 2018-12-07 MED FILL — ESCITALOPRAM 10 MG TABLET: 10 | 90 days supply | Qty: 90 | Fill #1

## 2019-03-21 MED FILL — ESCITALOPRAM 10 MG TABLET: 10 | 90 days supply | Qty: 90 | Fill #0

## 2019-06-21 MED FILL — ESCITALOPRAM 10 MG TABLET: 10 | 90 days supply | Qty: 90 | Fill #1

## 2019-09-22 MED FILL — ESCITALOPRAM 10 MG TABLET: 10 | 90 days supply | Qty: 90 | Fill #2

## 2019-12-20 MED FILL — ESCITALOPRAM 10 MG TABLET: 10 | 90 days supply | Qty: 90 | Fill #3

## 2021-07-30 ENCOUNTER — Encounter: Payer: Self-pay | Admitting: Internal Medicine

## 2021-07-30 ENCOUNTER — Ambulatory Visit (INDEPENDENT_AMBULATORY_CARE_PROVIDER_SITE_OTHER): Payer: No Typology Code available for payment source | Admitting: Internal Medicine

## 2021-07-30 VITALS — BP 138/86 | HR 86 | Resp 16 | Ht 71.0 in | Wt 246.4 lb

## 2021-07-30 DIAGNOSIS — E669 Obesity, unspecified: Secondary | ICD-10-CM | POA: Diagnosis not present

## 2021-07-30 DIAGNOSIS — F419 Anxiety disorder, unspecified: Secondary | ICD-10-CM

## 2021-07-30 DIAGNOSIS — Z125 Encounter for screening for malignant neoplasm of prostate: Secondary | ICD-10-CM

## 2021-07-30 DIAGNOSIS — M159 Polyosteoarthritis, unspecified: Secondary | ICD-10-CM

## 2021-07-30 DIAGNOSIS — E559 Vitamin D deficiency, unspecified: Secondary | ICD-10-CM

## 2021-07-30 DIAGNOSIS — I10 Essential (primary) hypertension: Secondary | ICD-10-CM

## 2021-07-30 DIAGNOSIS — Z1159 Encounter for screening for other viral diseases: Secondary | ICD-10-CM

## 2021-07-30 DIAGNOSIS — R7303 Prediabetes: Secondary | ICD-10-CM

## 2021-07-30 DIAGNOSIS — Z1211 Encounter for screening for malignant neoplasm of colon: Secondary | ICD-10-CM

## 2021-07-30 DIAGNOSIS — M1A9XX0 Chronic gout, unspecified, without tophus (tophi): Secondary | ICD-10-CM

## 2021-07-30 DIAGNOSIS — Z0000 Encounter for general adult medical examination without abnormal findings: Secondary | ICD-10-CM

## 2021-07-30 MED ORDER — NAPROXEN 500 MG PO TABS: 500.0000 mg | ORAL_TABLET | Freq: Two times a day (BID) | ORAL | 2 refills | Status: DC

## 2021-07-30 MED ORDER — METOPROLOL TARTRATE 25 MG PO TABS: 25.0000 mg | ORAL_TABLET | Freq: Three times a day (TID) | ORAL | 5 refills | Status: DC

## 2021-07-30 NOTE — Assessment & Plan Note (Signed)
Continue to follow low-carb diet and perform moderate exercise/walking at least 150 minutes/week ?

## 2021-07-30 NOTE — Assessment & Plan Note (Signed)
Ordered PSA after discussing its limitations for prostate cancer screening, including false positive results leading additional investigations. 

## 2021-07-30 NOTE — Assessment & Plan Note (Signed)
Takes naproxen as needed, refilled ?Advised to alternate with Tylenol arthritis ?

## 2021-07-30 NOTE — Assessment & Plan Note (Signed)
Check HbA1c ?Advised to continue to follow low-carb diet for now ?

## 2021-07-30 NOTE — Assessment & Plan Note (Signed)
Used to take Xanax in the past ?Currently well controlled ?Takes an additional dose of metoprolol as needed for situational anxiety ?

## 2021-07-30 NOTE — Progress Notes (Signed)
? ?New Patient Office Visit ? ?Subjective:  ?Patient ID: Phillip Mccall, male    DOB: 03/09/1956  Age: 66 y.o. MRN: [id number] ? ?CC:  ?Chief Complaint  ?Patient presents with  ? New Patient (Initial Visit)  ?  New patient was seeing dr Cindie Laroche just establishing care   ? ? ?HPI ?Phillip Mccall is a 66 y.o. male with past medical history of HTN, gout and OA who presents for establishing care. ? ?HTN: BP is well-controlled. Takes medications regularly. Patient denies headache, dizziness, chest pain, dyspnea or palpitations.  He was started on metoprolol for HTN as he was also having anxiety and palpitations.  He was given a trial of lisinopril, but he had drowsiness with it.  He currently takes metoprolol 25 mg BID, and takes additional dose as needed for situational anxiety and palpitations. ? ?He has history of gout, but has not had any recent flareup.  He takes allopurinol as needed for acute gout flareups, but agrees to avoid taking it as needed for now. ? ?He takes naproxen as needed for OA of knee.  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 ? ?He has been following low-carb diet for prediabetes and obesity.  He has lost about 70 pounds with low-carb diet. ? ? ?Past Medical History:  ?Diagnosis Date  ? Hypertension   ? Renal disorder   ? Kidney Stones  ? ? ?Past Surgical History:  ?Procedure Laterality Date  ? HERNIA REPAIR  1999  ? ? ?History reviewed. No pertinent family history. ? ?Social History  ? ?Socioeconomic History  ? Marital status: Married  ?  Spouse name: Not on file  ? Number of children: Not on file  ? Years of education: Not on file  ? Highest education level: Not on file  ?Occupational History  ? Not on file  ?Tobacco Use  ? Smoking status: Never  ? Smokeless tobacco: Not on file  ?Substance and Sexual Activity  ? Alcohol use: No  ? Drug use: Not on file  ? Sexual activity: Not on file  ?Other Topics Concern  ? Not on file  ?Social History Narrative  ? Not on file   ? ?Social Determinants of Health  ? ?Financial Resource Strain: Not on file  ?Food Insecurity: Not on file  ?Transportation Needs: Not on file  ?Physical Activity: Not on file  ?Stress: Not on file  ?Social Connections: Not on file  ?Intimate Partner Violence: Not on file  ? ? ?ROS ?Review of Systems  ?Constitutional:  Negative for chills and fever.  ?HENT:  Negative for congestion and sore throat.   ?Eyes:  Negative for pain and discharge.  ?Respiratory:  Negative for cough and shortness of breath.   ?Cardiovascular:  Negative for chest pain and palpitations.  ?Gastrointestinal:  Negative for diarrhea, nausea and vomiting.  ?Endocrine: Negative for polydipsia and polyuria.  ?Genitourinary:  Negative for dysuria and hematuria.  ?Musculoskeletal:  Positive for arthralgias. Negative for neck pain and neck stiffness.  ?Skin:  Negative for rash.  ?Neurological:  Negative for dizziness, weakness, numbness and headaches.  ?Psychiatric/Behavioral:  Negative for agitation and behavioral problems.   ? ?Objective:  ? ?Today's Vitals: BP 138/86 (BP Location: Right Arm, Patient Position: Sitting, Cuff Size: Normal)   Pulse 86   Resp 16   Ht _0  (1.803 m)   Wt 246 lb 6.4 oz (111.8 kg)   SpO2 99%   BMI 34.37 kg/m?  ? ?Physical Exam ?  Vitals reviewed.  ?Constitutional:   ?   General: He is not in acute distress. ?   Appearance: He is obese. He is not diaphoretic.  ?HENT:  ?   Head: Normocephalic and atraumatic.  ?   Nose: Nose normal.  ?   Mouth/Throat:  ?   Mouth: Mucous membranes are moist.  ?Eyes:  ?   General: No scleral icterus. ?   Extraocular Movements: Extraocular movements intact.  ?Cardiovascular:  ?   Rate and Rhythm: Normal rate and regular rhythm.  ?   Pulses: Normal pulses.  ?   Heart sounds: Normal heart sounds. No murmur heard. ?Pulmonary:  ?   Breath sounds: Normal breath sounds. No wheezing or rales.  ?Musculoskeletal:  ?   Cervical back: Neck supple. No tenderness.  ?   Right lower leg: No edema.  ?    Left lower leg: No edema.  ?Skin: ?   General: Skin is warm.  ?   Findings: No rash.  ?Neurological:  ?   General: No focal deficit present.  ?   Mental Status: He is alert and oriented to person, place, and time.  ?   Sensory: No sensory deficit.  ?   Motor: No weakness.  ?Psychiatric:     ?   Mood and Affect: Mood normal.     ?   Behavior: Behavior normal.  ? ? ?Assessment & Plan:  ? ?Problem List Items Addressed This Visit   ? ?  ? Cardiovascular and Mediastinum  ? Essential hypertension - Primary  ?  BP Readings from Last 1 Encounters:  ?07/30/21 138/86  ?Well-controlled with metoprolol, also takes it for situational anxiety ?Counseled for compliance with the medications ?Advised DASH diet and moderate exercise/walking, at least 150 mins/week ? ?  ?  ? Relevant Medications  ? metoprolol tartrate (LOPRESSOR) 25 MG tablet  ?  ? Musculoskeletal and Integument  ? Primary osteoarthritis involving multiple joints  ?  Takes naproxen as needed, refilled ?Advised to alternate with Tylenol arthritis ?  ?  ? Relevant Medications  ? naproxen (NAPROSYN) 500 MG tablet  ?  ? Other  ? Anxiety  ?  Used to take Xanax in the past ?Currently well controlled ?Takes an additional dose of metoprolol as needed for situational anxiety ?  ?  ? Prostate cancer screening  ?  Ordered PSA after discussing its limitations for prostate cancer screening, including false positive results leading additional investigations. ?  ?  ? Relevant Orders  ? PSA  ? Prediabetes  ?  Check HbA1c ?Advised to continue to follow low-carb diet for now ?  ?  ? Relevant Orders  ? Hemoglobin A1c  ? CMP14+EGFR  ? Chronic gout without tophus  ?  Check uric acid level ?Reports taking allopurinol as needed, advised to avoid taking allopurinol when he has acute flareup as it can worsen gout flareup ?  ?  ? Relevant Orders  ? Uric acid  ? Obesity (BMI 30-39.9)  ?  Continue to follow low-carb diet and perform moderate exercise/walking at least 150 minutes/week ?  ?   ? ?Other Visit Diagnoses   ? ? Special screening for malignant neoplasms, colon      ? Relevant Orders  ? Ambulatory referral to Gastroenterology  ? Vitamin D deficiency      ? Relevant Orders  ? VITAMIN D 25 Hydroxy (Vit-D Deficiency, Fractures)  ? Need for hepatitis C screening test      ? Relevant Orders  ?  Hepatitis C Antibody  ? ?  ? ? ?Outpatient Encounter Medications as of 07/30/2021  ?Medication Sig  ? Multiple Vitamins-Minerals (CENTRUM SILVER ADULT 50+) TABS Take 1 tablet by mouth every morning.  ? naproxen (NAPROSYN) 500 MG tablet Take 1 tablet (500 mg total) by mouth 2 (two) times daily with a meal.  ? [DISCONTINUED] metoprolol tartrate (LOPRESSOR) 25 MG tablet Take 25 mg by mouth 2 (two) times daily.  ? metoprolol tartrate (LOPRESSOR) 25 MG tablet Take 1 tablet (25 mg total) by mouth in the morning, at noon, and at bedtime. Third dose only as needed for palpitations.  ? [DISCONTINUED] HYDROcodone-acetaminophen (NORCO/VICODIN) 5-325 MG per tablet 1 or 2 tabs PO q6 hours prn pain (Patient not taking: Reported on 07/30/2021)  ? [DISCONTINUED] methocarbamol (ROBAXIN) 500 MG tablet Take 2 tablets (1,000 mg total) by mouth 4 (four) times daily as needed for muscle spasms (muscle spasm/pain). (Patient not taking: Reported on 07/30/2021)  ? ?No facility-administered encounter medications on file as of 07/30/2021.  ? ? ?Follow-up: Return in about 3 months (around 10/29/2021) for Annual physical.  ? ?Lindell Spar, MD ?

## 2021-07-30 NOTE — Assessment & Plan Note (Signed)
Check uric acid level ?Reports taking allopurinol as needed, advised to avoid taking allopurinol when he has acute flareup as it can worsen gout flareup ?

## 2021-07-30 NOTE — Assessment & Plan Note (Signed)
BP Readings from Last 1 Encounters:  ?07/30/21 138/86  ? ?Well-controlled with metoprolol, also takes it for situational anxiety ?Counseled for compliance with the medications ?Advised DASH diet and moderate exercise/walking, at least 150 mins/week ? ?

## 2021-07-30 NOTE — Patient Instructions (Signed)
Please continue to take medications as prescribed.  Please continue to follow low salt diet and perform moderate exercise/walking at least 150 mins/week.  

## 2021-09-10 ENCOUNTER — Encounter: Payer: Self-pay | Admitting: Internal Medicine

## 2021-10-01 ENCOUNTER — Ambulatory Visit (AMBULATORY_SURGERY_CENTER): Payer: Self-pay | Admitting: *Deleted

## 2021-10-01 ENCOUNTER — Other Ambulatory Visit (HOSPITAL_COMMUNITY): Payer: Self-pay

## 2021-10-01 VITALS — Ht 71.0 in | Wt 250.0 lb

## 2021-10-01 DIAGNOSIS — Z1211 Encounter for screening for malignant neoplasm of colon: Secondary | ICD-10-CM

## 2021-10-01 MED ORDER — NA SULFATE-K SULFATE-MG SULF 17.5-3.13-1.6 GM/177ML PO SOLN
1.0000 | ORAL | 0 refills | Status: DC
  Filled 2021-10-01: qty 354, 1d supply, fill #0

## 2021-10-01 NOTE — Progress Notes (Signed)
Patient and wife is here in-person for PV. Patient denies any allergies to eggs or soy. Patient denies any problems with anesthesia/sedation. Patient is not on any oxygen at home. Patient is not taking any diet/weight loss medications or blood thinners. Patient is aware of our care-partner policy.  EMMI education assigned to the patient for the procedure, sent to [place].

## 2021-10-20 ENCOUNTER — Encounter: Payer: Self-pay | Admitting: Family Medicine

## 2021-10-20 ENCOUNTER — Ambulatory Visit (INDEPENDENT_AMBULATORY_CARE_PROVIDER_SITE_OTHER): Payer: No Typology Code available for payment source | Admitting: Family Medicine

## 2021-10-20 VITALS — BP 142/98 | HR 67 | Ht 71.0 in | Wt 243.6 lb

## 2021-10-20 DIAGNOSIS — N6452 Nipple discharge: Secondary | ICD-10-CM | POA: Insufficient documentation

## 2021-10-20 DIAGNOSIS — Z1239 Encounter for other screening for malignant neoplasm of breast: Secondary | ICD-10-CM | POA: Diagnosis not present

## 2021-10-20 NOTE — Progress Notes (Signed)
Established Patient Office Visit  Subjective:  Patient ID: Phillip Mccall, male    DOB: 1956/02/11  Age: 66 y.o. MRN: [id number]  CC:  Chief Complaint  Patient presents with   Breast Problem    Has noticed his right nipple dripping has noticed it twice, first time was 10/09/2021, the second time was 10/12/2021.     HPI Phillip Mccall is a 66 y.o. male with past medical history of essential hypertension presents with c/o of right nipple discharged. Onset of symptoms was on 10/09/21 during a gout attack; the Next symptom was on 10/12/21. No breast pain or nipple tenderness was noted. No mass/lump, retractions, or dimpling noted. The discharge is clear and watery. Discharge is only from the right breast.   Past Medical History:  Diagnosis Date   Hypertension    Renal disorder    Kidney Stones   Umbilical hernia     Past Surgical History:  Procedure Laterality Date   COLONOSCOPY  03/20/1996   Dr.Perry   HERNIA REPAIR  04/20/1997    Family History  Problem Relation Age of Onset   Colon polyps Father    Colon cancer Neg Hx    Esophageal cancer Neg Hx    Stomach cancer Neg Hx    Rectal cancer Neg Hx     Social History   Socioeconomic History   Marital status: Married    Spouse name: Not on file   Number of children: Not on file   Years of education: Not on file   Highest education level: Not on file  Occupational History   Not on file  Tobacco Use   Smoking status: Some Days    Types: Cigarettes, Cigars   Smokeless tobacco: Not on file  Vaping Use   Vaping Use: Never used  Substance and Sexual Activity   Alcohol use: Not Currently    Comment: occ   Drug use: Not Currently   Sexual activity: Not on file  Other Topics Concern   Not on file  Social History Narrative   Not on file   Social Determinants of Health   Financial Resource Strain: Not on file  Food Insecurity: Not on file  Transportation Needs: Not on file  Physical Activity: Not on file   Stress: Not on file  Social Connections: Not on file  Intimate Partner Violence: Not on file    Outpatient Medications Prior to Visit  Medication Sig Dispense Refill   Bioflavonoid Products (QUERCETIN COMPLEX IMMUNE PO) Take by mouth.     Cholecalciferol (VITAMIN D3 PO) Take by mouth.     metoprolol tartrate (LOPRESSOR) 25 MG tablet Take 1 tablet (25 mg total) by mouth in the morning, at noon, and at bedtime. Third dose only as needed for palpitations. 90 tablet 5   Multiple Vitamins-Minerals (CENTRUM SILVER ADULT 50+) TABS Take 1 tablet by mouth every morning.     Multiple Vitamins-Minerals (ZINC PO) Take by mouth.     Na Sulfate-K Sulfate-Mg Sulf 17.5-3.13-1.6 GM/177ML SOLN Take 1 kit by mouth as directed. 354 mL 0   naproxen (NAPROSYN) 500 MG tablet Take 1 tablet (500 mg total) by mouth 2 (two) times daily with a meal. 30 tablet 2   Turmeric (QC TUMERIC COMPLEX PO) Take by mouth.     No facility-administered medications prior to visit.    Allergies  Allergen Reactions   Sulfonamide Derivatives Hives and Rash    REACTION: hives/rash    ROS Review of Systems  Constitutional:  Negative for fatigue and fever.  Gastrointestinal:  Negative for nausea and vomiting.      Objective:    Physical Exam Cardiovascular:     Rate and Rhythm: Normal rate.     Pulses: Normal pulses.     Heart sounds: Normal heart sounds.  Pulmonary:     Effort: Pulmonary effort is normal.     Breath sounds: Normal breath sounds.  Chest:  Breasts:    Right: Nipple discharge present. No swelling, bleeding, inverted nipple, mass, skin change or tenderness.     Left: No swelling, bleeding, inverted nipple, mass, nipple discharge, skin change or tenderness.  Abdominal:     Palpations: Abdomen is soft.  Neurological:     Mental Status: He is alert.     BP (!) 142/98   Pulse 67   Ht $R'5\' 11"'sR$  (1.803 m)   Wt 243 lb 9.6 oz (110.5 kg)   SpO2 97%   BMI 33.98 kg/m  Wt Readings from Last 3 Encounters:   10/20/21 243 lb 9.6 oz (110.5 kg)  10/01/21 250 lb (113.4 kg)  07/30/21 246 lb 6.4 oz (111.8 kg)    No results found for: "TSH" Lab Results  Component Value Date   WBC 9.5 04/14/2009   HGB 14.4 04/14/2009   HCT 42.8 04/14/2009   MCV 85.0 04/14/2009   PLT 266 04/14/2009   No results found for: "NA", "K", "CHLORIDE", "CO2", "GLUCOSE", "BUN", "CREATININE", "BILITOT", "ALKPHOS", "AST", "ALT", "PROT", "ALBUMIN", "CALCIUM", "ANIONGAP", "EGFR", "GFR" No results found for: "CHOL" No results found for: "HDL" No results found for: "LDLCALC" No results found for: "TRIG" No results found for: "CHOLHDL" No results found for: "HGBA1C"    Assessment & Plan:   Problem List Items Addressed This Visit       Other   Nipple discharge in male - Primary    Due to the patient having nipple discharge, I will place a referral for a mammogram to r/o breast cancer and check his prolactin levels.      Relevant Orders   Prolactin   MM DIAG BREAST TOMO BILATERAL   US BREAST LTD UNI RIGHT INC AXILLA   Other Visit Diagnoses     Encounter for screening for malignant neoplasm of breast, unspecified screening modality           No orders of the defined types were placed in this encounter.   Follow-up: No follow-ups on file.    Alvira Monday, FNP

## 2021-10-20 NOTE — Assessment & Plan Note (Signed)
Due to the patient having nipple discharge, I will place a referral for a mammogram to r/o breast cancer and check his prolactin levels.

## 2021-10-20 NOTE — Patient Instructions (Signed)
I appreciate the opportunity to provide care to you today!      Labs: please stop by the lab in the morning to get your blood drawn (Prolactin levels)    Please stop by [facility] hospital to get your mammogram     Please continue to a heart-healthy diet and increase your physical activities. Try to exercise for 15mns at least three times a week.      It was a pleasure to see you and I look forward to continuing to work together on your health and well-being. Please do not hesitate to call the office if you need care or have questions about your care.   Have a wonderful day and week. With Gratitude, GAlvira MondayMSN, FNP-BC

## 2021-10-22 ENCOUNTER — Ambulatory Visit (INDEPENDENT_AMBULATORY_CARE_PROVIDER_SITE_OTHER): Payer: No Typology Code available for payment source | Admitting: Internal Medicine

## 2021-10-22 ENCOUNTER — Encounter: Payer: Self-pay | Admitting: Internal Medicine

## 2021-10-22 ENCOUNTER — Telehealth: Payer: Self-pay

## 2021-10-22 DIAGNOSIS — M1A0710 Idiopathic chronic gout, right ankle and foot, without tophus (tophi): Secondary | ICD-10-CM | POA: Diagnosis not present

## 2021-10-22 MED ORDER — ALLOPURINOL 100 MG PO TABS: 100.0000 mg | ORAL_TABLET | Freq: Every day | ORAL | 3 refills | Status: AC

## 2021-10-22 MED ORDER — COLCHICINE 0.6 MG PO TABS: ORAL_TABLET | ORAL | 0 refills | Status: DC

## 2021-10-22 NOTE — Assessment & Plan Note (Signed)
Started colchicine Check uric acid level Reports taking allopurinol as needed, advised to avoid taking allopurinol when he has acute flareup as it can worsen gout flareup -advised to start taking allopurinol once acute gout flareup resolves

## 2021-10-22 NOTE — Patient Instructions (Signed)
Please take colchicine as prescribed for gout flareup.  Please start taking allopurinol only after acute gout flareup resolves.

## 2021-10-22 NOTE — Telephone Encounter (Signed)
Patient came by the office and said need refill Allopurinol 100 mg for his gout. No longer gets from his old primary care dr.    Pharmacy: [address] [place]

## 2021-10-22 NOTE — Telephone Encounter (Signed)
Left voicemail for patient to call our office to schedule a virtual visit.

## 2021-10-22 NOTE — Progress Notes (Signed)
Virtual Visit via Telephone Note   This visit type was conducted due to national recommendations for restrictions regarding the COVID-19 Pandemic (e.g. social distancing) in an effort to limit this patient's exposure and mitigate transmission in our community.  Due to his co-morbid illnesses, this patient is at least at moderate risk for complications without adequate follow up.  This format is felt to be most appropriate for this patient at this time.  The patient did not have access to video technology/had technical difficulties with video requiring transitioning to audio format only (telephone).  All issues noted in this document were discussed and addressed.  No physical exam could be performed with this format.  Evaluation Performed:  Follow-up visit  Date:  10/22/2021   ID:  Phillip Mccall, DOB 09/11/1955, MRN [id number]  Patient Location: Home Provider Location: Office/Clinic  Participants: Patient Location of Patient: Home Location of Provider: Telehealth Consent was obtain for visit to be over via telehealth. I verified that I am speaking with the correct person using two identifiers.  PCP:  Lindell Spar, MD   Chief Complaint: Gout  History of Present Illness:    Phillip Mccall is a 66 y.o. male who has a televisit for complaint of gout flareup in the last week.  He reports having hot dogs and seafood, which could have provoked gout flareup.  He tried taking allopurinol, which slightly improved his pain and swelling.  Denies any fever or chills.  Denies any recent injury.  The patient does not have symptoms concerning for COVID-19 infection (fever, chills, cough, or new shortness of breath).   Past Medical, Surgical, Social History, Allergies, and Medications have been Reviewed.  Past Medical History:  Diagnosis Date   Hypertension    Renal disorder    Kidney Stones   Umbilical hernia    Past Surgical History:  Procedure Laterality Date   COLONOSCOPY   03/20/1996   Dr.Perry   HERNIA REPAIR  04/20/1997     Current Meds  Medication Sig   Bioflavonoid Products (QUERCETIN COMPLEX IMMUNE PO) Take by mouth.   Cholecalciferol (VITAMIN D3 PO) Take by mouth.   metoprolol tartrate (LOPRESSOR) 25 MG tablet Take 1 tablet (25 mg total) by mouth in the morning, at noon, and at bedtime. Third dose only as needed for palpitations.   Multiple Vitamins-Minerals (CENTRUM SILVER ADULT 50+) TABS Take 1 tablet by mouth every morning.   Multiple Vitamins-Minerals (ZINC PO) Take by mouth.   Na Sulfate-K Sulfate-Mg Sulf 17.5-3.13-1.6 GM/177ML SOLN Take 1 kit by mouth as directed.   naproxen (NAPROSYN) 500 MG tablet Take 1 tablet (500 mg total) by mouth 2 (two) times daily with a meal.   Turmeric (QC TUMERIC COMPLEX PO) Take by mouth.     Allergies:   Sulfonamide derivatives   ROS:   Please see the history of present illness.     All other systems reviewed and are negative.   Labs/Other Tests and Data Reviewed:    Recent Labs: No results found for requested labs within last 365 days.   Recent Lipid Panel No results found for: "CHOL", "TRIG", "HDL", "CHOLHDL", "LDLCALC", "LDLDIRECT"  Wt Readings from Last 3 Encounters:  10/20/21 243 lb 9.6 oz (110.5 kg)  10/01/21 250 lb (113.4 kg)  07/30/21 246 lb 6.4 oz (111.8 kg)     ASSESSMENT & PLAN:    Chronic gout without tophus Started colchicine Check uric acid level Reports taking allopurinol as needed, advised to avoid taking  allopurinol when he has acute flareup as it can worsen gout flareup -advised to start taking allopurinol once acute gout flareup resolves    Time:   Today, I have spent 9 minutes reviewing the chart, including problem list, medications, and with the patient with telehealth technology discussing the above problems.   Medication Adjustments/Labs and Tests Ordered: Current medicines are reviewed at length with the patient today.  Concerns regarding medicines are outlined  above.   Tests Ordered: No orders of the defined types were placed in this encounter.   Medication Changes: No orders of the defined types were placed in this encounter.    Note: This dictation was prepared with Dragon dictation along with smaller phrase technology. Similar sounding words can be transcribed inadequately or may not be corrected upon review. Any transcriptional errors that result from this process are unintentional.      Disposition:  Follow up  Signed, Lindell Spar, MD  10/22/2021 1:35 PM     [facility]

## 2021-10-23 ENCOUNTER — Encounter: Payer: Self-pay | Admitting: Internal Medicine

## 2021-10-23 LAB — PROLACTIN: Prolactin: 12.1 ng/mL (ref 4.0–15.2)

## 2021-10-23 NOTE — Progress Notes (Signed)
Please inform the patient that his prolactin levels is within normal limits

## 2021-10-29 ENCOUNTER — Other Ambulatory Visit (HOSPITAL_COMMUNITY): Payer: Self-pay | Admitting: Family Medicine

## 2021-10-29 ENCOUNTER — Encounter: Payer: Self-pay | Admitting: Internal Medicine

## 2021-10-29 ENCOUNTER — Ambulatory Visit (AMBULATORY_SURGERY_CENTER): Payer: No Typology Code available for payment source | Admitting: Internal Medicine

## 2021-10-29 VITALS — BP 120/81 | HR 53 | Temp 97.1°F | Resp 9 | Ht 71.0 in | Wt 250.0 lb

## 2021-10-29 DIAGNOSIS — Z1211 Encounter for screening for malignant neoplasm of colon: Secondary | ICD-10-CM | POA: Diagnosis present

## 2021-10-29 DIAGNOSIS — D122 Benign neoplasm of ascending colon: Secondary | ICD-10-CM

## 2021-10-29 DIAGNOSIS — Z860101 Personal history of adenomatous and serrated colon polyps: Secondary | ICD-10-CM

## 2021-10-29 DIAGNOSIS — Z86010 Personal history of colonic polyps: Secondary | ICD-10-CM

## 2021-10-29 DIAGNOSIS — D125 Benign neoplasm of sigmoid colon: Secondary | ICD-10-CM | POA: Diagnosis not present

## 2021-10-29 DIAGNOSIS — N6452 Nipple discharge: Secondary | ICD-10-CM

## 2021-10-29 HISTORY — DX: Personal history of adenomatous and serrated colon polyps: Z86.0101

## 2021-10-29 HISTORY — DX: Personal history of colonic polyps: Z86.010

## 2021-10-29 MED ORDER — SODIUM CHLORIDE 0.9 % IV SOLN: 500.0000 mL | Freq: Once | INTRAVENOUS | Status: DC

## 2021-10-29 NOTE — Progress Notes (Signed)
Called to room to assist during endoscopic procedure.  Patient ID and intended procedure confirmed with present staff. Received instructions for my participation in the procedure from the performing physician.  

## 2021-10-29 NOTE — Progress Notes (Signed)
[facility] History and Physical   Primary Care Physician:  Lindell Spar, MD   Reason for Procedure:   CRCA screening  Plan:    colonoscopy     HPI: Phillip Mccall is a 66 y.o. male for colon cancer screening   Past Medical History:  Diagnosis Date   Hypertension    Renal disorder    Kidney Stones   Umbilical hernia     Past Surgical History:  Procedure Laterality Date   COLONOSCOPY  03/20/1996   Dr.Perry   HERNIA REPAIR  04/20/1997    Prior to Admission medications   Medication Sig Start Date End Date Taking? Authorizing Provider  allopurinol (ZYLOPRIM) 100 MG tablet Take 1 tablet (100 mg total) by mouth daily. 10/22/21  Yes Lindell Spar, MD  Bioflavonoid Products (QUERCETIN COMPLEX IMMUNE PO) Take by mouth.   Yes [provider]  Cholecalciferol (VITAMIN D3 PO) Take by mouth.   Yes [provider]  colchicine 0.6 MG tablet Take 2 tablets today, followed by 1 tablet 1 hour later, then 1 tablet once daily. 10/22/21  Yes Lindell Spar, MD  metoprolol tartrate (LOPRESSOR) 25 MG tablet Take 1 tablet (25 mg total) by mouth in the morning, at noon, and at bedtime. Third dose only as needed for palpitations. 07/30/21  Yes Lindell Spar, MD  Multiple Vitamins-Minerals (CENTRUM SILVER ADULT 50+) TABS Take 1 tablet by mouth every morning.   Yes [provider]  Multiple Vitamins-Minerals (ZINC PO) Take by mouth.   Yes [provider]  Turmeric (QC TUMERIC COMPLEX PO) Take by mouth.   Yes [provider]  naproxen (NAPROSYN) 500 MG tablet Take 1 tablet (500 mg total) by mouth 2 (two) times daily with a meal. 07/30/21   Lindell Spar, MD    Current Outpatient Medications  Medication Sig Dispense Refill   allopurinol (ZYLOPRIM) 100 MG tablet Take 1 tablet (100 mg total) by mouth daily. 30 tablet 3   Bioflavonoid Products (QUERCETIN COMPLEX IMMUNE PO) Take by mouth.     Cholecalciferol (VITAMIN D3 PO) Take by mouth.      colchicine 0.6 MG tablet Take 2 tablets today, followed by 1 tablet 1 hour later, then 1 tablet once daily. 20 tablet 0   metoprolol tartrate (LOPRESSOR) 25 MG tablet Take 1 tablet (25 mg total) by mouth in the morning, at noon, and at bedtime. Third dose only as needed for palpitations. 90 tablet 5   Multiple Vitamins-Minerals (CENTRUM SILVER ADULT 50+) TABS Take 1 tablet by mouth every morning.     Multiple Vitamins-Minerals (ZINC PO) Take by mouth.     Turmeric (QC TUMERIC COMPLEX PO) Take by mouth.     naproxen (NAPROSYN) 500 MG tablet Take 1 tablet (500 mg total) by mouth 2 (two) times daily with a meal. 30 tablet 2   Current Facility-Administered Medications  Medication Dose Route Frequency Provider Last Rate Last Admin   0.9 %  sodium chloride infusion  500 mL Intravenous Once Gatha Mayer, MD        Allergies as of 10/29/2021 - Review Complete 10/29/2021  Allergen Reaction Noted   Sulfonamide derivatives Hives and Rash 07/02/2009    Family History  Problem Relation Age of Onset   Colon polyps Father    Colon cancer Neg Hx    Esophageal cancer Neg Hx    Stomach cancer Neg Hx    Rectal cancer Neg Hx     Social History  Socioeconomic History   Marital status: Married    Spouse name: Not on file   Number of children: Not on file   Years of education: Not on file   Highest education level: Not on file  Occupational History   Not on file  Tobacco Use   Smoking status: Some Days    Types: Cigarettes, Cigars   Smokeless tobacco: Not on file  Vaping Use   Vaping Use: Never used  Substance and Sexual Activity   Alcohol use: Not Currently    Comment: occ   Drug use: Not Currently   Sexual activity: Not on file  Other Topics Concern   Not on file  Social History Narrative   Not on file   Social Determinants of Health   Financial Resource Strain: Not on file  Food Insecurity: Not on file  Transportation Needs: Not on file  Physical Activity: Not on file   Stress: Not on file  Social Connections: Not on file  Intimate Partner Violence: Not on file    Review of Systems:  All other review of systems negative except as mentioned in the HPI.  Physical Exam: Vital signs BP (!) 166/82   Pulse 65   Temp (!) 97.1 F (36.2 C)   Ht '5\' 11"'$  (1.803 m)   Wt 250 lb (113.4 kg)   SpO2 98%   BMI 34.87 kg/m   General:   Alert,  Well-developed, well-nourished, pleasant and cooperative in NAD Lungs:  Clear throughout to auscultation.   Heart:  Regular rate and rhythm; no murmurs, clicks, rubs,  or gallops. Abdomen:  Soft, nontender and nondistended. Normal bowel sounds.   Neuro/Psych:  Alert and cooperative. Normal mood and affect. A and O x 3   '@Grayton Lobo'$  Simonne Maffucci, MD, [facility] [phone] (pager) 10/29/2021 1:22 PM@

## 2021-10-29 NOTE — Op Note (Signed)
[facility] Patient Name: Phillip Mccall Procedure Date: 10/29/2021 1:20 PM MRN: [id number] Endoscopist: Gatha Mayer , MD Age: 66 Referring MD:  Date of Birth: 1956-04-18 Gender: Male Account #: [id number] Procedure:                Colonoscopy Indications:              Screening for colorectal malignant neoplasm Medicines:                Monitored Anesthesia Care Procedure:                Pre-Anesthesia Assessment:                           - Prior to the procedure, a History and Physical                            was performed, and patient medications and                            allergies were reviewed. The patient's tolerance of                            previous anesthesia was also reviewed. The risks                            and benefits of the procedure and the sedation                            options and risks were discussed with the patient.                            All questions were answered, and informed consent                            was obtained. Prior Anticoagulants: The patient has                            taken no previous anticoagulant or antiplatelet                            agents. ASA Grade Assessment: II - A patient with                            mild systemic disease. After reviewing the risks                            and benefits, the patient was deemed in                            satisfactory condition to undergo the procedure.                           After obtaining informed consent, the colonoscope  was passed under direct vision. Throughout the                            procedure, the patient's blood pressure, pulse, and                            oxygen saturations were monitored continuously. The                            Olympus CF-HQ190L [id number]) Colonoscope was                            introduced through the anus and advanced to the the                            cecum, identified by  appendiceal orifice and                            ileocecal valve. The colonoscopy was somewhat                            difficult due to significant looping. Successful                            completion of the procedure was aided by applying                            abdominal pressure. The patient tolerated the                            procedure well. The quality of the bowel                            preparation was excellent. The ileocecal valve,                            appendiceal orifice, and rectum were photographed.                            The bowel preparation used was SUPREP via split                            dose instruction. Scope In: 1:30:30 PM Scope Out: 1:53:06 PM Scope Withdrawal Time: 0 hours 16 minutes 29 seconds  Total Procedure Duration: 0 hours 22 minutes 36 seconds  Findings:                 The perianal and digital rectal examinations were                            normal.                           Three sessile and semi-pedunculated polyps were  found in the sigmoid colon and ascending colon. The                            polyps were diminutive in size. These polyps were                            removed with a cold snare. Resection and retrieval                            were complete. Verification of patient                            identification for the specimen was done. Estimated                            blood loss was minimal.                           The exam was otherwise without abnormality on                            direct and retroflexion views. Complications:            No immediate complications. Estimated Blood Loss:     Estimated blood loss was minimal. Impression:               - Three diminutive polyps in the sigmoid colon and                            in the ascending colon, removed with a cold snare.                            Resected and retrieved.                           - The  examination was otherwise normal on direct                            and retroflexion views. Recommendation:           - Patient has a contact number available for                            emergencies. The signs and symptoms of potential                            delayed complications were discussed with the                            patient. Return to normal activities tomorrow.                            Written discharge instructions were provided to the  patient.                           - Resume previous diet.                           - Continue present medications.                           - Repeat colonoscopy is recommended. The                            colonoscopy date will be determined after pathology                            results from today's exam become available for                            review. Gatha Mayer, MD 10/29/2021 2:00:02 PM This report has been signed electronically.

## 2021-10-29 NOTE — Progress Notes (Signed)
A/ox3, pleased with MAC, report to RN 

## 2021-10-29 NOTE — Patient Instructions (Addendum)
I found and removed 3 tiny polyps that look benign. I will let you know pathology results and when to have another routine colonoscopy by mail and/or My Chart.  I appreciate the opportunity to care for you. Gatha Mayer, MD, [facility]     Handout was given to your care partner on polyps. You may resume your current medications today. Await biopsy results.  May take 1-3 weeks to receive pathology results. Please call if any questions or concerns.   YOU HAD AN ENDOSCOPIC PROCEDURE TODAY AT [facility]:   Refer to the procedure report that was given to you for any specific questions about what was found during the examination.  If the procedure report does not answer your questions, please call your gastroenterologist to clarify.  If you requested that your care partner not be given the details of your procedure findings, then the procedure report has been included in a sealed envelope for you to review at your convenience later.  YOU SHOULD EXPECT: Some feelings of bloating in the abdomen. Passage of more gas than usual.  Walking can help get rid of the air that was put into your GI tract during the procedure and reduce the bloating. If you had a lower endoscopy (such as a colonoscopy or flexible sigmoidoscopy) you may notice spotting of blood in your stool or on the toilet paper. If you underwent a bowel prep for your procedure, you may not have a normal bowel movement for a few days.  Please Note:  You might notice some irritation and congestion in your nose or some drainage.  This is from the oxygen used during your procedure.  There is no need for concern and it should clear up in a day or so.  SYMPTOMS TO REPORT IMMEDIATELY:  Following lower endoscopy (colonoscopy or flexible sigmoidoscopy):  Excessive amounts of blood in the stool  Significant tenderness or worsening of abdominal pains  Swelling of the abdomen that is new, acute  Fever of 100F or higher   For urgent  or emergent issues, a gastroenterologist can be reached at any hour by calling [phone]. Do not use MyChart messaging for urgent concerns.    DIET:  We do recommend a small meal at first, but then you may proceed to your regular diet.  Drink plenty of fluids but you should avoid alcoholic beverages for 24 hours.  ACTIVITY:  You should plan to take it easy for the rest of today and you should NOT DRIVE or use heavy machinery until tomorrow (because of the sedation medicines used during the test).    FOLLOW UP: Our staff will call the number listed on your records the next business day following your procedure.  We will call around 7:15- 8:00 am to check on you and address any questions or concerns that you may have regarding the information given to you following your procedure. If we do not reach you, we will leave a message.  If you develop any symptoms (ie: fever, flu-like symptoms, shortness of breath, cough etc.) before then, please call [phone].  If you test positive for Covid 19 in the 2 weeks post procedure, please call and report this information to [place].    If any biopsies were taken you will be contacted by phone or by letter within the next 1-3 weeks.  Please call us at [phone] if you have not heard about the biopsies in 3 weeks.    SIGNATURES/CONFIDENTIALITY: You and/or your care partner  have signed paperwork which will be entered into your electronic medical record.  These signatures attest to the fact that that the information above on your After Visit Summary has been reviewed and is understood.  Full responsibility of the confidentiality of this discharge information lies with you and/or your care-partner.

## 2021-10-29 NOTE — Progress Notes (Signed)
Pt's states no medical or surgical changes since previsit or office visit. 

## 2021-10-29 NOTE — Progress Notes (Signed)
No problems noted in the recovery room. maw 

## 2021-10-30 ENCOUNTER — Telehealth: Payer: Self-pay | Admitting: *Deleted

## 2021-10-30 NOTE — Telephone Encounter (Signed)
  Follow up Call-     10/29/2021   12:54 PM  Call back number  Post procedure Call Back phone  # [phone]  Permission to leave phone message Yes     Patient questions:  Do you have a fever, pain , or abdominal swelling? No. Pain Score  0 *  Have you tolerated food without any problems? Yes.    Have you been able to return to your normal activities? Yes.    Do you have any questions about your discharge instructions: Diet   No. Medications  No. Follow up visit  No.  Do you have questions or concerns about your Care? No.  Actions: * If pain score is 4 or above: No action needed, pain <4.

## 2021-11-03 ENCOUNTER — Encounter: Payer: Self-pay | Admitting: Internal Medicine

## 2021-11-04 ENCOUNTER — Ambulatory Visit (HOSPITAL_COMMUNITY)
Admission: RE | Admit: 2021-11-04 | Discharge: 2021-11-04 | Disposition: A | Discharge status: Home / Self Care | Payer: No Typology Code available for payment source | Source: Ambulatory Visit | Attending: Family Medicine | Admitting: Family Medicine

## 2021-11-04 DIAGNOSIS — N6452 Nipple discharge: Secondary | ICD-10-CM | POA: Diagnosis present

## 2021-12-09 LAB — CBC WITH DIFFERENTIAL/PLATELET
Basophils Absolute: 0.1 10*3/uL (ref 0.0–0.2)
Basos: 1 %
EOS (ABSOLUTE): 0.2 10*3/uL (ref 0.0–0.4)
Eos: 3 %
Hematocrit: 46.4 % (ref 37.5–51.0)
Hemoglobin: 15.4 g/dL (ref 13.0–17.7)
Immature Grans (Abs): 0 10*3/uL (ref 0.0–0.1)
Immature Granulocytes: 0 %
Lymphocytes Absolute: 1.7 10*3/uL (ref 0.7–3.1)
Lymphs: 29 %
MCH: 28.9 pg (ref 26.6–33.0)
MCHC: 33.2 g/dL (ref 31.5–35.7)
MCV: 87 fL (ref 79–97)
Monocytes Absolute: 0.5 10*3/uL (ref 0.1–0.9)
Monocytes: 8 %
Neutrophils Absolute: 3.4 10*3/uL (ref 1.4–7.0)
Neutrophils: 59 %
Platelets: 277 10*3/uL (ref 150–450)
RBC: 5.32 x10E6/uL (ref 4.14–5.80)
RDW: 13.7 % (ref 11.6–15.4)
WBC: 5.8 10*3/uL (ref 3.4–10.8)

## 2021-12-09 LAB — LIPID PANEL
Chol/HDL Ratio: 4.9 ratio (ref 0.0–5.0)
Cholesterol, Total: 222 mg/dL — ABNORMAL HIGH (ref 100–199)
HDL: 45 mg/dL (ref >39)
LDL Chol Calc (NIH): 162 mg/dL — ABNORMAL HIGH (ref 0–99)
Triglycerides: 83 mg/dL (ref 0–149)
VLDL Cholesterol Cal: 15 mg/dL (ref 5–40)

## 2021-12-09 LAB — CMP14+EGFR
ALT: 26 IU/L (ref 0–44)
AST: 19 IU/L (ref 0–40)
Albumin/Globulin Ratio: 2.1 (ref 1.2–2.2)
Albumin: 4.5 g/dL (ref 3.9–4.9)
Alkaline Phosphatase: 66 IU/L (ref 44–121)
BUN/Creatinine Ratio: 22 (ref 10–24)
BUN: 24 mg/dL (ref 8–27)
Bilirubin Total: 0.4 mg/dL (ref 0.0–1.2)
CO2: 21 mmol/L (ref 20–29)
Calcium: 9.5 mg/dL (ref 8.6–10.2)
Chloride: 106 mmol/L (ref 96–106)
Creatinine, Ser: 1.07 mg/dL (ref 0.76–1.27)
Globulin, Total: 2.1 g/dL (ref 1.5–4.5)
Glucose: 103 mg/dL — ABNORMAL HIGH (ref 70–99)
Potassium: 4.7 mmol/L (ref 3.5–5.2)
Sodium: 141 mmol/L (ref 134–144)
Total Protein: 6.6 g/dL (ref 6.0–8.5)
eGFR: 76 mL/min/{1.73_m2} (ref >59)

## 2021-12-09 LAB — TSH: TSH: 1.86 u[IU]/mL (ref 0.450–4.500)

## 2021-12-09 LAB — URIC ACID: Uric Acid: 9.2 mg/dL — ABNORMAL HIGH (ref 3.8–8.4)

## 2021-12-09 LAB — HEPATITIS C ANTIBODY: Hep C Virus Ab: NONREACTIVE

## 2021-12-09 LAB — HEMOGLOBIN A1C
Est. average glucose Bld gHb Est-mCnc: 117 mg/dL
Hgb A1c MFr Bld: 5.7 % — ABNORMAL HIGH (ref 4.8–5.6)

## 2021-12-09 LAB — PSA: Prostate Specific Ag, Serum: 0.9 ng/mL (ref 0.0–4.0)

## 2021-12-09 LAB — VITAMIN D 25 HYDROXY (VIT D DEFICIENCY, FRACTURES): Vit D, 25-Hydroxy: 67.8 ng/mL (ref 30.0–100.0)

## 2021-12-11 ENCOUNTER — Other Ambulatory Visit: Payer: Self-pay | Admitting: *Deleted

## 2021-12-11 ENCOUNTER — Encounter: Payer: Self-pay | Admitting: Internal Medicine

## 2021-12-11 ENCOUNTER — Ambulatory Visit (INDEPENDENT_AMBULATORY_CARE_PROVIDER_SITE_OTHER): Payer: No Typology Code available for payment source | Admitting: Internal Medicine

## 2021-12-11 VITALS — BP 124/84 | HR 62 | Resp 18 | Ht 71.0 in | Wt 238.6 lb

## 2021-12-11 DIAGNOSIS — M1A0710 Idiopathic chronic gout, right ankle and foot, without tophus (tophi): Secondary | ICD-10-CM | POA: Diagnosis not present

## 2021-12-11 DIAGNOSIS — Z0001 Encounter for general adult medical examination with abnormal findings: Secondary | ICD-10-CM

## 2021-12-11 DIAGNOSIS — I10 Essential (primary) hypertension: Secondary | ICD-10-CM

## 2021-12-11 DIAGNOSIS — E782 Mixed hyperlipidemia: Secondary | ICD-10-CM | POA: Diagnosis not present

## 2021-12-11 DIAGNOSIS — Z2821 Immunization not carried out because of patient refusal: Secondary | ICD-10-CM

## 2021-12-11 MED ORDER — METOPROLOL TARTRATE 25 MG PO TABS: 25.0000 mg | ORAL_TABLET | Freq: Three times a day (TID) | ORAL | 5 refills | Status: DC

## 2021-12-11 NOTE — Assessment & Plan Note (Signed)

## 2021-12-11 NOTE — Assessment & Plan Note (Signed)
BP Readings from Last 1 Encounters:  12/11/21 124/84   Well-controlled with metoprolol, also takes it for situational anxiety Counseled for compliance with the medications Advised DASH diet and moderate exercise/walking, at least 150 mins/week

## 2021-12-11 NOTE — Patient Instructions (Signed)
Please continue taking medications as prescribed.  Please continue to follow low carb diet and perform moderate exercise/walking as tolerated. 

## 2021-12-11 NOTE — Progress Notes (Signed)
Established Patient Office Visit  Subjective:  Patient ID: Phillip Mccall, male    DOB: 1955/07/26  Age: 66 y.o. MRN: [id number]  CC:  Chief Complaint  Patient presents with   Annual Exam    Annual exam     HPI Phillip Mccall is a 66 y.o. male with past medical history of HTN, gout and OA who presents for annual physical.  Gout: He has gout flareup has resolved now with colchicine.  His uric acid level was elevated, but he prefers to avoid taking allopurinol.  He reports that he had high protein diet before gout flareup, and has been trying to modify his diet now.  HLD: His cholesterol was elevated, but he states that he had increased carb content in his diet as he was worried about rapid weight loss.  He has lost about 60 lbs since 02/22 with low-carb diet.     Past Medical History:  Diagnosis Date   Hx of adenomatous polyp of colon 10/29/2021   Single diminutive polyp recall 20 33   Hypertension    Renal disorder    Kidney Stones   Umbilical hernia     Past Surgical History:  Procedure Laterality Date   COLONOSCOPY  03/20/1996   Repeat July 2023   HERNIA REPAIR  04/20/1997    Family History  Problem Relation Age of Onset   Colon polyps Father    Colon cancer Neg Hx    Esophageal cancer Neg Hx    Stomach cancer Neg Hx    Rectal cancer Neg Hx     Social History   Socioeconomic History   Marital status: Married    Spouse name: Not on file   Number of children: Not on file   Years of education: Not on file   Highest education level: Not on file  Occupational History   Not on file  Tobacco Use   Smoking status: Some Days    Types: Cigarettes, Cigars   Smokeless tobacco: Not on file  Vaping Use   Vaping Use: Never used  Substance and Sexual Activity   Alcohol use: Not Currently    Comment: occ   Drug use: Not Currently   Sexual activity: Not on file  Other Topics Concern   Not on file  Social History Narrative   Not on file   Social  Determinants of Health   Financial Resource Strain: Not on file  Food Insecurity: Not on file  Transportation Needs: Not on file  Physical Activity: Not on file  Stress: Not on file  Social Connections: Not on file  Intimate Partner Violence: Not on file    Outpatient Medications Prior to Visit  Medication Sig Dispense Refill   allopurinol (ZYLOPRIM) 100 MG tablet Take 1 tablet (100 mg total) by mouth daily. 30 tablet 3   Bioflavonoid Products (QUERCETIN COMPLEX IMMUNE PO) Take by mouth.     Cholecalciferol (VITAMIN D3 PO) Take by mouth.     colchicine 0.6 MG tablet Take 2 tablets today, followed by 1 tablet 1 hour later, then 1 tablet once daily. 20 tablet 0   metoprolol tartrate (LOPRESSOR) 25 MG tablet Take 1 tablet (25 mg total) by mouth in the morning, at noon, and at bedtime. Third dose only as needed for palpitations. 90 tablet 5   Multiple Vitamins-Minerals (CENTRUM SILVER ADULT 50+) TABS Take 1 tablet by mouth every morning.     Multiple Vitamins-Minerals (ZINC PO) Take by mouth.     naproxen (  NAPROSYN) 500 MG tablet Take 1 tablet (500 mg total) by mouth 2 (two) times daily with a meal. 30 tablet 2   Turmeric (QC TUMERIC COMPLEX PO) Take by mouth.     No facility-administered medications prior to visit.    Allergies  Allergen Reactions   Sulfonamide Derivatives Hives and Rash    REACTION: hives/rash    ROS Review of Systems  Constitutional:  Negative for chills and fever.  HENT:  Negative for congestion and sore throat.   Eyes:  Negative for pain and discharge.  Respiratory:  Negative for cough and shortness of breath.   Cardiovascular:  Negative for chest pain and palpitations.  Gastrointestinal:  Negative for diarrhea, nausea and vomiting.  Endocrine: Negative for polydipsia and polyuria.  Genitourinary:  Negative for dysuria and hematuria.  Musculoskeletal:  Positive for arthralgias. Negative for neck pain and neck stiffness.  Skin:  Negative for rash.   Neurological:  Negative for dizziness, weakness, numbness and headaches.  Psychiatric/Behavioral:  Negative for agitation and behavioral problems.       Objective:    Physical Exam Vitals reviewed.  Constitutional:      General: He is not in acute distress.    Appearance: He is obese. He is not diaphoretic.  HENT:     Head: Normocephalic and atraumatic.     Nose: Nose normal.     Mouth/Throat:     Mouth: Mucous membranes are moist.  Eyes:     General: No scleral icterus.    Extraocular Movements: Extraocular movements intact.  Cardiovascular:     Rate and Rhythm: Normal rate and regular rhythm.     Pulses: Normal pulses.     Heart sounds: Normal heart sounds. No murmur heard. Pulmonary:     Breath sounds: Normal breath sounds. No wheezing or rales.  Abdominal:     Palpations: Abdomen is soft.     Tenderness: There is no abdominal tenderness.  Musculoskeletal:     Cervical back: Neck supple. No tenderness.     Right lower leg: No edema.     Left lower leg: No edema.  Skin:    General: Skin is warm.     Findings: No rash.  Neurological:     General: No focal deficit present.     Mental Status: He is alert and oriented to person, place, and time.     Sensory: No sensory deficit.     Motor: No weakness.  Psychiatric:        Mood and Affect: Mood normal.        Behavior: Behavior normal.     BP 124/84 (BP Location: Left Arm)   Pulse 62   Resp 18   Ht 5\' 11"  (1.803 m)   Wt 238 lb 9.6 oz (108.2 kg)   SpO2 98%   BMI 33.28 kg/m  Wt Readings from Last 3 Encounters:  12/11/21 238 lb 9.6 oz (108.2 kg)  10/29/21 250 lb (113.4 kg)  10/20/21 243 lb 9.6 oz (110.5 kg)    Lab Results  Component Value Date   TSH 1.860 12/08/2021   Lab Results  Component Value Date   WBC 5.8 12/08/2021   HGB 15.4 12/08/2021   HCT 46.4 12/08/2021   MCV 87 12/08/2021   PLT 277 12/08/2021   Lab Results  Component Value Date   NA 141 12/08/2021   K 4.7 12/08/2021   CO2 21  12/08/2021   GLUCOSE 103 (H) 12/08/2021   BUN 24 12/08/2021  CREATININE 1.07 12/08/2021   BILITOT 0.4 12/08/2021   ALKPHOS 66 12/08/2021   AST 19 12/08/2021   ALT 26 12/08/2021   PROT 6.6 12/08/2021   ALBUMIN 4.5 12/08/2021   CALCIUM 9.5 12/08/2021   EGFR 76 12/08/2021   Lab Results  Component Value Date   CHOL 222 (H) 12/08/2021   Lab Results  Component Value Date   HDL 45 12/08/2021   Lab Results  Component Value Date   LDLCALC 162 (H) 12/08/2021   Lab Results  Component Value Date   TRIG 83 12/08/2021   Lab Results  Component Value Date   CHOLHDL 4.9 12/08/2021   Lab Results  Component Value Date   HGBA1C 5.7 (H) 12/08/2021      Assessment & Plan:   Problem List Items Addressed This Visit       Cardiovascular and Mediastinum   Essential hypertension    BP Readings from Last 1 Encounters:  12/11/21 124/84  Well-controlled with metoprolol, also takes it for situational anxiety Counseled for compliance with the medications Advised DASH diet and moderate exercise/walking, at least 150 mins/week        Other   Chronic gout without tophus    Started colchicine Checked uric acid level Reports taking allopurinol as needed, advised to avoid taking allopurinol when he has acute flareup as it can worsen gout flareup -advised to start taking allopurinol regularly       Relevant Orders   Uric acid   Encounter for general adult medical examination with abnormal findings - Primary    Physical exam as documented. Counseling done  re healthy lifestyle involving commitment to 150 minutes exercise per week, heart healthy diet, and attaining healthy weight.The importance of adequate sleep also discussed. Changes in health habits are decided on by the patient with goals and time frames  set for achieving them. Immunization and cancer screening needs are specifically addressed at this visit.      Mixed hyperlipidemia    Lipid profile reviewed Advised to start  statin, but he prefers to do diet modification for now      Relevant Orders   Lipid Profile   Other Visit Diagnoses     Refused influenza vaccine       Refused pneumococcal vaccination           No orders of the defined types were placed in this encounter.   Follow-up: Return in about 6 months (around 06/13/2022) for HTN and HLD.    Lindell Spar, MD

## 2021-12-11 NOTE — Assessment & Plan Note (Signed)
Lipid profile reviewed Advised to start statin, but he prefers to do diet modification for now

## 2021-12-11 NOTE — Assessment & Plan Note (Signed)
Started colchicine Checked uric acid level Reports taking allopurinol as needed, advised to avoid taking allopurinol when he has acute flareup as it can worsen gout flareup -advised to start taking allopurinol regularly

## 2022-01-01 ENCOUNTER — Telehealth: Payer: Self-pay | Admitting: Internal Medicine

## 2022-01-01 ENCOUNTER — Other Ambulatory Visit: Payer: Self-pay | Admitting: *Deleted

## 2022-01-01 DIAGNOSIS — M1A0710 Idiopathic chronic gout, right ankle and foot, without tophus (tophi): Secondary | ICD-10-CM

## 2022-01-01 MED ORDER — COLCHICINE 0.6 MG PO TABS: ORAL_TABLET | ORAL | 0 refills | Status: DC

## 2022-01-01 NOTE — Telephone Encounter (Signed)
Pt called stating he is needing a refill on colchicine 0.6 MG tablet. Can you please refill?    Phillip Mccall

## 2022-01-01 NOTE — Telephone Encounter (Signed)
Medication sent to pharmacy  

## 2022-01-15 ENCOUNTER — Other Ambulatory Visit (HOSPITAL_COMMUNITY): Payer: Self-pay

## 2022-01-16 ENCOUNTER — Other Ambulatory Visit (HOSPITAL_COMMUNITY): Payer: Self-pay

## 2022-02-22 ENCOUNTER — Other Ambulatory Visit: Payer: Self-pay | Admitting: Internal Medicine

## 2022-02-22 DIAGNOSIS — M1A0710 Idiopathic chronic gout, right ankle and foot, without tophus (tophi): Secondary | ICD-10-CM

## 2022-02-23 ENCOUNTER — Other Ambulatory Visit (HOSPITAL_COMMUNITY): Payer: Self-pay

## 2022-02-23 MED ORDER — COLCHICINE 0.6 MG PO TABS
ORAL_TABLET | ORAL | 0 refills | Status: DC
  Filled 2022-02-23: qty 20, 18d supply, fill #0

## 2022-06-03 ENCOUNTER — Telehealth: Payer: Self-pay | Admitting: Internal Medicine

## 2022-06-03 ENCOUNTER — Other Ambulatory Visit: Payer: Self-pay

## 2022-06-03 DIAGNOSIS — M159 Polyosteoarthritis, unspecified: Secondary | ICD-10-CM

## 2022-06-03 MED ORDER — NAPROXEN 500 MG PO TABS: 500.0000 mg | ORAL_TABLET | Freq: Two times a day (BID) | ORAL | 2 refills | Status: DC

## 2022-06-03 NOTE — Telephone Encounter (Signed)
Pt needs refill on  naproxen (NAPROSYN) 500 MG tablet   [facility] [phone] - [place], [place] AT [id number], [place] [address] Phone: [phone]  Fax: [phone] DEA #: [place]:[id number] [place] Reason: --

## 2022-06-03 NOTE — Telephone Encounter (Signed)
Refills sent to pharmacy. 

## 2022-06-16 ENCOUNTER — Ambulatory Visit: Payer: No Typology Code available for payment source | Admitting: Internal Medicine

## 2022-07-17 ENCOUNTER — Ambulatory Visit: Payer: No Typology Code available for payment source | Admitting: Internal Medicine

## 2022-08-07 DIAGNOSIS — E782 Mixed hyperlipidemia: Secondary | ICD-10-CM | POA: Diagnosis not present

## 2022-08-07 DIAGNOSIS — M1A0710 Idiopathic chronic gout, right ankle and foot, without tophus (tophi): Secondary | ICD-10-CM | POA: Diagnosis not present

## 2022-08-08 LAB — LIPID PANEL
Chol/HDL Ratio: 5.5 ratio — ABNORMAL HIGH (ref 0.0–5.0)
Cholesterol, Total: 221 mg/dL — ABNORMAL HIGH (ref 100–199)
HDL: 40 mg/dL (ref >39)
LDL Chol Calc (NIH): 166 mg/dL — ABNORMAL HIGH (ref 0–99)
Triglycerides: 82 mg/dL (ref 0–149)
VLDL Cholesterol Cal: 15 mg/dL (ref 5–40)

## 2022-08-08 LAB — URIC ACID: Uric Acid: 9 mg/dL — ABNORMAL HIGH (ref 3.8–8.4)

## 2022-08-11 ENCOUNTER — Ambulatory Visit (INDEPENDENT_AMBULATORY_CARE_PROVIDER_SITE_OTHER): Payer: 59 | Admitting: Internal Medicine

## 2022-08-11 ENCOUNTER — Encounter: Payer: Self-pay | Admitting: Internal Medicine

## 2022-08-11 VITALS — BP 158/86 | HR 68 | Ht 71.0 in | Wt 246.0 lb

## 2022-08-11 DIAGNOSIS — M1A0710 Idiopathic chronic gout, right ankle and foot, without tophus (tophi): Secondary | ICD-10-CM | POA: Diagnosis not present

## 2022-08-11 DIAGNOSIS — I10 Essential (primary) hypertension: Secondary | ICD-10-CM

## 2022-08-11 DIAGNOSIS — R7303 Prediabetes: Secondary | ICD-10-CM | POA: Diagnosis not present

## 2022-08-11 DIAGNOSIS — E669 Obesity, unspecified: Secondary | ICD-10-CM

## 2022-08-11 DIAGNOSIS — M159 Polyosteoarthritis, unspecified: Secondary | ICD-10-CM

## 2022-08-11 DIAGNOSIS — Z125 Encounter for screening for malignant neoplasm of prostate: Secondary | ICD-10-CM

## 2022-08-11 DIAGNOSIS — E782 Mixed hyperlipidemia: Secondary | ICD-10-CM | POA: Diagnosis not present

## 2022-08-11 DIAGNOSIS — F419 Anxiety disorder, unspecified: Secondary | ICD-10-CM

## 2022-08-11 DIAGNOSIS — E559 Vitamin D deficiency, unspecified: Secondary | ICD-10-CM | POA: Diagnosis not present

## 2022-08-11 MED ORDER — METOPROLOL TARTRATE 25 MG PO TABS: 25.0000 mg | ORAL_TABLET | Freq: Three times a day (TID) | ORAL | 5 refills | Status: DC

## 2022-08-11 NOTE — Assessment & Plan Note (Signed)
Takes naproxen as needed, refilled ?Advised to alternate with Tylenol arthritis ?

## 2022-08-11 NOTE — Assessment & Plan Note (Addendum)
Ordered PSA after discussing its limitations for prostate cancer screening, including false positive results leading to additional investigations. 
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
## 2022-08-11 NOTE — Patient Instructions (Signed)
Please continue to take medications as prescribed.  Please continue to follow low salt diet and perform moderate exercise/walking at least 150 mins/week. 

## 2022-08-11 NOTE — Assessment & Plan Note (Signed)
Continue to follow low-carb diet and perform moderate exercise/walking at least 150 minutes/week ?

## 2022-08-11 NOTE — Assessment & Plan Note (Signed)
Checked uric acid level Advised to start taking allopurinol regularly Has colchicine for acute flareup

## 2022-08-11 NOTE — Assessment & Plan Note (Addendum)
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

## 2022-08-11 NOTE — Assessment & Plan Note (Signed)
Check HbA1c ?Advised to continue to follow low-carb diet for now ?

## 2022-08-11 NOTE — Assessment & Plan Note (Signed)
Used to take Xanax in the past ?Currently well controlled ?Takes an additional dose of metoprolol as needed for situational anxiety ?

## 2022-08-11 NOTE — Assessment & Plan Note (Signed)
Lipid profile reviewed Advised to start statin, but he prefers to do diet modification for now 

## 2022-11-12 ENCOUNTER — Other Ambulatory Visit: Payer: Self-pay | Admitting: Internal Medicine

## 2022-11-12 DIAGNOSIS — I10 Essential (primary) hypertension: Secondary | ICD-10-CM

## 2022-12-11 DIAGNOSIS — I10 Essential (primary) hypertension: Secondary | ICD-10-CM | POA: Diagnosis not present

## 2022-12-11 DIAGNOSIS — F419 Anxiety disorder, unspecified: Secondary | ICD-10-CM | POA: Diagnosis not present

## 2022-12-11 DIAGNOSIS — E782 Mixed hyperlipidemia: Secondary | ICD-10-CM | POA: Diagnosis not present

## 2022-12-11 DIAGNOSIS — R7303 Prediabetes: Secondary | ICD-10-CM | POA: Diagnosis not present

## 2022-12-11 DIAGNOSIS — E559 Vitamin D deficiency, unspecified: Secondary | ICD-10-CM | POA: Diagnosis not present

## 2022-12-12 LAB — LIPID PANEL
Chol/HDL Ratio: 5.6 ratio — ABNORMAL HIGH (ref 0.0–5.0)
Cholesterol, Total: 246 mg/dL — ABNORMAL HIGH (ref 100–199)
HDL: 44 mg/dL
LDL Chol Calc (NIH): 184 mg/dL — ABNORMAL HIGH (ref 0–99)
Triglycerides: 102 mg/dL (ref 0–149)
VLDL Cholesterol Cal: 18 mg/dL (ref 5–40)

## 2022-12-12 LAB — HEMOGLOBIN A1C
Est. average glucose Bld gHb Est-mCnc: 120 mg/dL
Hgb A1c MFr Bld: 5.8 % — ABNORMAL HIGH (ref 4.8–5.6)

## 2022-12-12 LAB — CMP14+EGFR
ALT: 32 IU/L (ref 0–44)
AST: 21 IU/L (ref 0–40)
Albumin: 4.4 g/dL (ref 3.9–4.9)
Alkaline Phosphatase: 75 IU/L (ref 44–121)
BUN/Creatinine Ratio: 19 (ref 10–24)
BUN: 20 mg/dL (ref 8–27)
Bilirubin Total: 0.4 mg/dL (ref 0.0–1.2)
CO2: 21 mmol/L (ref 20–29)
Calcium: 9.6 mg/dL (ref 8.6–10.2)
Chloride: 104 mmol/L (ref 96–106)
Creatinine, Ser: 1.05 mg/dL (ref 0.76–1.27)
Globulin, Total: 2.3 g/dL (ref 1.5–4.5)
Glucose: 98 mg/dL (ref 70–99)
Potassium: 4.2 mmol/L (ref 3.5–5.2)
Sodium: 142 mmol/L (ref 134–144)
Total Protein: 6.7 g/dL (ref 6.0–8.5)
eGFR: 77 mL/min/1.73

## 2022-12-12 LAB — VITAMIN D 25 HYDROXY (VIT D DEFICIENCY, FRACTURES): Vit D, 25-Hydroxy: 83.1 ng/mL (ref 30.0–100.0)

## 2022-12-15 ENCOUNTER — Encounter: Payer: Self-pay | Admitting: Internal Medicine

## 2022-12-15 ENCOUNTER — Ambulatory Visit (INDEPENDENT_AMBULATORY_CARE_PROVIDER_SITE_OTHER): Payer: 59 | Admitting: Internal Medicine

## 2022-12-15 VITALS — BP 134/84 | HR 64 | Ht 71.0 in | Wt 234.2 lb

## 2022-12-15 DIAGNOSIS — Z0001 Encounter for general adult medical examination with abnormal findings: Secondary | ICD-10-CM

## 2022-12-15 DIAGNOSIS — I10 Essential (primary) hypertension: Secondary | ICD-10-CM

## 2022-12-15 DIAGNOSIS — E669 Obesity, unspecified: Secondary | ICD-10-CM

## 2022-12-15 DIAGNOSIS — E782 Mixed hyperlipidemia: Secondary | ICD-10-CM | POA: Diagnosis not present

## 2022-12-15 DIAGNOSIS — M1A0710 Idiopathic chronic gout, right ankle and foot, without tophus (tophi): Secondary | ICD-10-CM

## 2022-12-15 DIAGNOSIS — M159 Polyosteoarthritis, unspecified: Secondary | ICD-10-CM

## 2022-12-15 DIAGNOSIS — R7303 Prediabetes: Secondary | ICD-10-CM | POA: Diagnosis not present

## 2022-12-15 MED ORDER — COLCHICINE 0.6 MG PO TABS
ORAL_TABLET | ORAL | 1 refills | Status: DC
Start: 2022-12-15 — End: 2023-04-06

## 2022-12-15 MED ORDER — NAPROXEN 500 MG PO TABS
500.0000 mg | ORAL_TABLET | Freq: Two times a day (BID) | ORAL | 2 refills | Status: DC
Start: 2022-12-15 — End: 2023-09-02

## 2022-12-15 NOTE — Assessment & Plan Note (Addendum)
Checked uric acid level Have advised to start taking allopurinol regularly, but he does not agree - prefers to manage with diet alone Has colchicine for acute flareup

## 2022-12-15 NOTE — Assessment & Plan Note (Addendum)
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

## 2022-12-15 NOTE — Assessment & Plan Note (Signed)
Takes naproxen as needed, refilled ?Advised to alternate with Tylenol arthritis ?

## 2022-12-15 NOTE — Assessment & Plan Note (Signed)
Lipid profile reviewed Advised to start statin, but he prefers to do diet modification for now - understands higher risk of CVA and CAD at LDL > 180.

## 2022-12-15 NOTE — Patient Instructions (Signed)
Please continue to take medications as prescribed.  Please continue to follow low carb diet and perform moderate exercise/walking at least 150 mins/week.  Please get fasting blood tests done before the next visit. 

## 2022-12-15 NOTE — Progress Notes (Signed)
Established Patient Office Visit  Subjective:  Patient ID: Phillip Mccall, male    DOB: 08/28/1955  Age: 67 y.o. MRN: [id number]  CC:  Chief Complaint  Patient presents with   Annual Exam    HPI Phillip Mccall is a 67 y.o. male with past medical history of HTN, gout and OA who presents for annual physical.  Gout: Denies any recent flareup. His gout flareup had resolved now with colchicine.  His uric acid level was elevated, but he prefers to avoid taking allopurinol.  He reports that he had high protein diet before gout flareup, and has been trying to modify his diet now.  HLD: His cholesterol was elevated, but he states that he had increased fatty food content in his diet.  He has lost about 60 lbs since 02/22 with low-carb diet and another 14 lbs since 04/24.     Past Medical History:  Diagnosis Date   Hx of adenomatous polyp of colon 10/29/2021   Single diminutive polyp recall 20 33   Hypertension    Renal disorder    Kidney Stones   Umbilical hernia     Past Surgical History:  Procedure Laterality Date   COLONOSCOPY  03/20/1996   Repeat July 2023   HERNIA REPAIR  04/20/1997    Family History  Problem Relation Age of Onset   Colon polyps Father    Colon cancer Neg Hx    Esophageal cancer Neg Hx    Stomach cancer Neg Hx    Rectal cancer Neg Hx     Social History   Socioeconomic History   Marital status: Married    Spouse name: Not on file   Number of children: Not on file   Years of education: Not on file   Highest education level: Not on file  Occupational History   Not on file  Tobacco Use   Smoking status: Some Days    Types: Cigarettes, Cigars   Smokeless tobacco: Not on file  Vaping Use   Vaping status: Never Used  Substance and Sexual Activity   Alcohol use: Not Currently    Comment: occ   Drug use: Not Currently   Sexual activity: Not on file  Other Topics Concern   Not on file  Social History Narrative   Not on file   Social  Determinants of Health   Financial Resource Strain: Not on file  Food Insecurity: Not on file  Transportation Needs: Not on file  Physical Activity: Not on file  Stress: Not on file  Social Connections: Not on file  Intimate Partner Violence: Not on file    Outpatient Medications Prior to Visit  Medication Sig Dispense Refill   allopurinol (ZYLOPRIM) 100 MG tablet Take 1 tablet (100 mg total) by mouth daily. 30 tablet 3   Bioflavonoid Products (QUERCETIN COMPLEX IMMUNE PO) Take by mouth.     Cholecalciferol (VITAMIN D3 PO) Take by mouth.     metoprolol tartrate (LOPRESSOR) 25 MG tablet TAKE 1 TABLET BY MOUTH IN THE MORNING, AT NOON, AND AT BEDTIME. THIRD DOSE ONLY AS NEEDED FOR PALPITATIONS 90 tablet 5   Multiple Vitamins-Minerals (CENTRUM SILVER ADULT 50+) TABS Take 1 tablet by mouth every morning.     Multiple Vitamins-Minerals (ZINC PO) Take by mouth.     Turmeric (QC TUMERIC COMPLEX PO) Take by mouth.     colchicine 0.6 MG tablet Take 2 tablets by mouth today, followed by 1 tablet 1 hour later, then 1 tablet once  daily. 20 tablet 0   naproxen (NAPROSYN) 500 MG tablet Take 1 tablet (500 mg total) by mouth 2 (two) times daily with a meal. 30 tablet 2   No facility-administered medications prior to visit.    Allergies  Allergen Reactions   Sulfonamide Derivatives Hives and Rash    REACTION: hives/rash    ROS Review of Systems  Constitutional:  Negative for chills and fever.  HENT:  Negative for congestion and sore throat.   Eyes:  Negative for pain and discharge.  Respiratory:  Negative for cough and shortness of breath.   Cardiovascular:  Negative for chest pain and palpitations.  Gastrointestinal:  Negative for diarrhea, nausea and vomiting.  Endocrine: Negative for polydipsia and polyuria.  Genitourinary:  Negative for dysuria and hematuria.  Musculoskeletal:  Positive for arthralgias. Negative for neck pain and neck stiffness.  Skin:  Negative for rash.  Neurological:   Negative for dizziness, weakness, numbness and headaches.  Psychiatric/Behavioral:  Negative for agitation and behavioral problems.       Objective:    Physical Exam Vitals reviewed.  Constitutional:      General: He is not in acute distress.    Appearance: He is obese. He is not diaphoretic.  HENT:     Head: Normocephalic and atraumatic.     Nose: Nose normal.     Mouth/Throat:     Mouth: Mucous membranes are moist.  Eyes:     General: No scleral icterus.    Extraocular Movements: Extraocular movements intact.  Cardiovascular:     Rate and Rhythm: Normal rate and regular rhythm.     Pulses: Normal pulses.     Heart sounds: Normal heart sounds. No murmur heard. Pulmonary:     Breath sounds: Normal breath sounds. No wheezing or rales.  Abdominal:     Palpations: Abdomen is soft.     Tenderness: There is no abdominal tenderness.  Musculoskeletal:     Cervical back: Neck supple. No tenderness.     Right lower leg: No edema.     Left lower leg: No edema.  Skin:    General: Skin is warm.     Findings: No rash.  Neurological:     General: No focal deficit present.     Mental Status: He is alert and oriented to person, place, and time.     Cranial Nerves: No cranial nerve deficit.     Sensory: No sensory deficit.     Motor: No weakness.  Psychiatric:        Mood and Affect: Mood normal.        Behavior: Behavior normal.     BP 134/84 (BP Location: Left Arm) Comment: Home BP  Pulse 64   Ht 5\' 11"  (1.803 m)   Wt 234 lb 3.2 oz (106.2 kg)   SpO2 96%   BMI 32.66 kg/m  Wt Readings from Last 3 Encounters:  12/15/22 234 lb 3.2 oz (106.2 kg)  08/11/22 246 lb (111.6 kg)  12/11/21 238 lb 9.6 oz (108.2 kg)    Lab Results  Component Value Date   TSH 2.250 12/11/2022   Lab Results  Component Value Date   WBC 5.8 12/11/2022   HGB 15.5 12/11/2022   HCT 47.1 12/11/2022   MCV 87 12/11/2022   PLT 277 12/11/2022   Lab Results  Component Value Date   NA 142 12/11/2022    K 4.2 12/11/2022   CO2 21 12/11/2022   GLUCOSE 98 12/11/2022   BUN 20 12/11/2022  CREATININE 1.05 12/11/2022   BILITOT 0.4 12/11/2022   ALKPHOS 75 12/11/2022   AST 21 12/11/2022   ALT 32 12/11/2022   PROT 6.7 12/11/2022   ALBUMIN 4.4 12/11/2022   CALCIUM 9.6 12/11/2022   EGFR 77 12/11/2022   Lab Results  Component Value Date   CHOL 246 (H) 12/11/2022   Lab Results  Component Value Date   HDL 44 12/11/2022   Lab Results  Component Value Date   LDLCALC 184 (H) 12/11/2022   Lab Results  Component Value Date   TRIG 102 12/11/2022   Lab Results  Component Value Date   CHOLHDL 5.6 (H) 12/11/2022   Lab Results  Component Value Date   HGBA1C 5.8 (H) 12/11/2022      Assessment & Plan:   Problem List Items Addressed This Visit       Cardiovascular and Mediastinum   Essential hypertension    BP Readings from Last 1 Encounters:  12/15/22 134/84   Elevated today, 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 Well-controlled with metoprolol at home, also takes it for situational anxiety Counseled for compliance with the medications Advised DASH diet and moderate exercise/walking, at least 150 mins/week        Musculoskeletal and Integument   Primary osteoarthritis involving multiple joints    Takes naproxen as needed, refilled Advised to alternate with Tylenol arthritis      Relevant Medications   naproxen (NAPROSYN) 500 MG tablet   colchicine 0.6 MG tablet     Other   Prediabetes    Lab Results  Component Value Date   HGBA1C 5.8 (H) 12/11/2022    Advised to continue to follow low-carb diet for now      Chronic gout without tophus    Checked uric acid level Have advised to start taking allopurinol regularly, but he does not agree - prefers to manage with diet alone Has colchicine for acute flareup      Relevant Medications   colchicine 0.6 MG tablet   Obesity (BMI 30-39.9)    Continue to follow low-carb diet and perform moderate  exercise/walking at least 150 minutes/week      Encounter for general adult medical examination with abnormal findings - Primary    Physical exam as documented. Fasting blood tests reviewed today. Advised to get Shingrix and Tdap vaccines at local pharmacy.  Denies pneumococcal and flu vaccines.      Mixed hyperlipidemia    Lipid profile reviewed Advised to start statin, but he prefers to do diet modification for now - understands higher risk of CVA and CAD at LDL > 180.      Relevant Orders   Lipid Profile   CMP14+EGFR    Meds ordered this encounter  Medications   naproxen (NAPROSYN) 500 MG tablet    Sig: Take 1 tablet (500 mg total) by mouth 2 (two) times daily with a meal.    Dispense:  30 tablet    Refill:  2   colchicine 0.6 MG tablet    Sig: Take 2 tablets by mouth today, followed by 1 tablet 1 hour later, then 1 tablet once daily.    Dispense:  20 tablet    Refill:  1    Follow-up: Return in about 6 months (around 06/17/2023) for HLD.    Anabel Halon, MD

## 2022-12-15 NOTE — Assessment & Plan Note (Signed)
Continue to follow low-carb diet and perform moderate exercise/walking at least 150 minutes/week ?

## 2022-12-15 NOTE — Assessment & Plan Note (Signed)
Physical exam as documented. Fasting blood tests reviewed today. Advised to get Shingrix and Tdap vaccines at local pharmacy.  Denies pneumococcal and flu vaccines.

## 2022-12-15 NOTE — Assessment & Plan Note (Signed)
Lab Results  Component Value Date   HGBA1C 5.8 (H) 12/11/2022    Advised to continue to follow low-carb diet for now

## 2023-03-15 ENCOUNTER — Other Ambulatory Visit: Payer: Self-pay | Admitting: Internal Medicine

## 2023-03-15 DIAGNOSIS — I10 Essential (primary) hypertension: Secondary | ICD-10-CM

## 2023-04-06 ENCOUNTER — Other Ambulatory Visit: Payer: Self-pay

## 2023-04-06 DIAGNOSIS — M1A0710 Idiopathic chronic gout, right ankle and foot, without tophus (tophi): Secondary | ICD-10-CM

## 2023-04-06 MED ORDER — COLCHICINE 0.6 MG PO TABS
ORAL_TABLET | ORAL | 1 refills | Status: AC
Start: 2023-04-06 — End: ?

## 2023-04-06 NOTE — Telephone Encounter (Signed)
Copied from CRM [id number]. Topic: Clinical - Medication Refill >> Apr 06, 2023  9:40 AM Clayton Bibles wrote: Most Recent Primary Care Visit:  Provider: Anabel Halon  Department: [facility]  Visit Type: PHYSICAL  Date: 12/15/2022  Medication: colchicine 0.6 MG tablet  Has the patient contacted their pharmacy? No (Agent: If no, request that the patient contact the pharmacy for the refill. If patient does not wish to contact the pharmacy document the reason why and proceed with request.) (Agent: If yes, when and what did the pharmacy advise?)  Is this the correct pharmacy for this prescription? Yes [facility], [address] If no, delete pharmacy and type the correct one.  This is the patient's preferred pharmacy:  [facility] [phone] - [place], [place] - [address] AT [facility] [address] [place] [address] Phone: [phone] Fax: [phone]  [facility] [address] [place] [address] Phone: [phone] Fax: [phone]   Has the prescription been filled recently? No  Is the patient out of the medication? Yes  Has the patient been seen for an appointment in the last year OR does the patient have an upcoming appointment? Yes  Can we respond through MyChart? Yes  Agent: Please be advised that Rx refills may take up to 3 business days. We ask that you follow-up with your pharmacy.

## 2023-04-06 NOTE — Telephone Encounter (Signed)
Refill sent to pharmacy.   

## 2023-06-17 ENCOUNTER — Ambulatory Visit: Payer: 59 | Admitting: Internal Medicine

## 2023-08-17 ENCOUNTER — Ambulatory Visit: Payer: 59 | Admitting: Internal Medicine

## 2023-09-02 ENCOUNTER — Other Ambulatory Visit: Payer: Self-pay | Admitting: Internal Medicine

## 2023-09-02 DIAGNOSIS — M150 Primary generalized (osteo)arthritis: Secondary | ICD-10-CM

## 2023-09-02 DIAGNOSIS — I10 Essential (primary) hypertension: Secondary | ICD-10-CM

## 2023-09-02 NOTE — Telephone Encounter (Unsigned)
 Copied from CRM [id number]. Topic: Clinical - Medication Refill >> Sep 02, 2023  1:08 PM Bridgette Campus T wrote: Medication: naproxen  (NAPROSYN ) 500 MG tablet metoprolol  tartrate (LOPRESSOR ) 25 MG tablet  Has the patient contacted their pharmacy? No (Agent: If no, request that the patient contact the pharmacy for the refill. If patient does not wish to contact the pharmacy document the reason why and proceed with request.) (Agent: If yes, when and what did the pharmacy advise?)  This is the patient's preferred pharmacy:  [facility] [phone] - [place], [place] - [address] AT [facility] [address] [place] [address] Phone: [phone] Fax: [phone]    Is this the correct pharmacy for this prescription? Yes If no, delete pharmacy and type the correct one.   Has the prescription been filled recently? Yes  Is the patient out of the medication? Yes  Has the patient been seen for an appointment in the last year OR does the patient have an upcoming appointment? Yes  Can we respond through MyChart? Yes  Agent: Please be advised that Rx refills may take up to 3 business days. We ask that you follow-up with your pharmacy.

## 2023-09-03 MED ORDER — METOPROLOL TARTRATE 25 MG PO TABS
25.0000 mg | ORAL_TABLET | Freq: Three times a day (TID) | ORAL | 0 refills | Status: DC
Start: 2023-09-03 — End: 2023-12-13

## 2023-09-03 MED ORDER — NAPROXEN 500 MG PO TABS
500.0000 mg | ORAL_TABLET | Freq: Two times a day (BID) | ORAL | 2 refills | Status: DC
Start: 2023-09-03 — End: 2023-12-21

## 2023-10-04 ENCOUNTER — Telehealth: Payer: Self-pay

## 2023-10-04 NOTE — Telephone Encounter (Signed)
 Spoke to pt's wife let her know appt is not needed for labs he can walk in.

## 2023-10-04 NOTE — Telephone Encounter (Signed)
 Copied from CRM [id number]. Topic: General - Other >> Oct 04, 2023 12:08 PM Felizardo Hotter wrote: Reason for CRM: Pt would like to schedule labs for his appt on 12/16/2023 please call pt at [phone]

## 2023-10-14 ENCOUNTER — Ambulatory Visit: Admitting: Internal Medicine

## 2023-12-06 ENCOUNTER — Other Ambulatory Visit: Payer: Self-pay | Admitting: Internal Medicine

## 2023-12-06 ENCOUNTER — Telehealth: Payer: Self-pay

## 2023-12-06 DIAGNOSIS — M1A0710 Idiopathic chronic gout, right ankle and foot, without tophus (tophi): Secondary | ICD-10-CM

## 2023-12-06 DIAGNOSIS — Z125 Encounter for screening for malignant neoplasm of prostate: Secondary | ICD-10-CM

## 2023-12-06 DIAGNOSIS — R7303 Prediabetes: Secondary | ICD-10-CM

## 2023-12-06 DIAGNOSIS — I10 Essential (primary) hypertension: Secondary | ICD-10-CM

## 2023-12-06 DIAGNOSIS — E559 Vitamin D deficiency, unspecified: Secondary | ICD-10-CM

## 2023-12-06 DIAGNOSIS — E782 Mixed hyperlipidemia: Secondary | ICD-10-CM

## 2023-12-06 NOTE — Telephone Encounter (Signed)
 Copied from CRM #[id number]. Topic: Clinical - Request for Lab/Test Order >> Dec 06, 2023 11:09 AM Harlene ORN wrote: Reason for CRM: Patient called to request orders for blood work for his upcoming physical. Was told to keep his physical.

## 2023-12-06 NOTE — Telephone Encounter (Signed)
 Left detailed vm informing him lab orders are in he can come in a few days before physical .

## 2023-12-13 ENCOUNTER — Other Ambulatory Visit: Payer: Self-pay | Admitting: Internal Medicine

## 2023-12-13 DIAGNOSIS — I10 Essential (primary) hypertension: Secondary | ICD-10-CM

## 2023-12-14 DIAGNOSIS — R7303 Prediabetes: Secondary | ICD-10-CM | POA: Diagnosis not present

## 2023-12-14 DIAGNOSIS — E782 Mixed hyperlipidemia: Secondary | ICD-10-CM | POA: Diagnosis not present

## 2023-12-14 DIAGNOSIS — Z125 Encounter for screening for malignant neoplasm of prostate: Secondary | ICD-10-CM | POA: Diagnosis not present

## 2023-12-14 DIAGNOSIS — I10 Essential (primary) hypertension: Secondary | ICD-10-CM | POA: Diagnosis not present

## 2023-12-14 DIAGNOSIS — M1A0710 Idiopathic chronic gout, right ankle and foot, without tophus (tophi): Secondary | ICD-10-CM | POA: Diagnosis not present

## 2023-12-14 DIAGNOSIS — E559 Vitamin D deficiency, unspecified: Secondary | ICD-10-CM | POA: Diagnosis not present

## 2023-12-15 LAB — CBC WITH DIFFERENTIAL/PLATELET
Basophils Absolute: 0.1 x10E3/uL (ref 0.0–0.2)
Basos: 1 %
EOS (ABSOLUTE): 0.2 x10E3/uL (ref 0.0–0.4)
Eos: 3 %
Hematocrit: 48 % (ref 37.5–51.0)
Hemoglobin: 15.5 g/dL (ref 13.0–17.7)
Immature Grans (Abs): 0 x10E3/uL (ref 0.0–0.1)
Immature Granulocytes: 0 %
Lymphocytes Absolute: 1.6 x10E3/uL (ref 0.7–3.1)
Lymphs: 26 %
MCH: 28.9 pg (ref 26.6–33.0)
MCHC: 32.3 g/dL (ref 31.5–35.7)
MCV: 90 fL (ref 79–97)
Monocytes Absolute: 0.5 x10E3/uL (ref 0.1–0.9)
Monocytes: 8 %
Neutrophils Absolute: 3.8 x10E3/uL (ref 1.4–7.0)
Neutrophils: 62 %
Platelets: 276 x10E3/uL (ref 150–450)
RBC: 5.36 x10E6/uL (ref 4.14–5.80)
RDW: 13.1 % (ref 11.6–15.4)
WBC: 6.1 x10E3/uL (ref 3.4–10.8)

## 2023-12-15 LAB — TSH: TSH: 2 u[IU]/mL (ref 0.450–4.500)

## 2023-12-15 LAB — LIPID PANEL
Chol/HDL Ratio: 6.1 ratio — ABNORMAL HIGH (ref 0.0–5.0)
Cholesterol, Total: 251 mg/dL — ABNORMAL HIGH (ref 100–199)
HDL: 41 mg/dL (ref >39)
LDL Chol Calc (NIH): 197 mg/dL — ABNORMAL HIGH (ref 0–99)
Triglycerides: 77 mg/dL (ref 0–149)
VLDL Cholesterol Cal: 13 mg/dL (ref 5–40)

## 2023-12-15 LAB — CMP14+EGFR
ALT: 26 IU/L (ref 0–44)
AST: 19 IU/L (ref 0–40)
Albumin: 4.5 g/dL (ref 3.9–4.9)
Alkaline Phosphatase: 69 IU/L (ref 44–121)
BUN/Creatinine Ratio: 21 (ref 10–24)
BUN: 23 mg/dL (ref 8–27)
Bilirubin Total: 0.5 mg/dL (ref 0.0–1.2)
CO2: 22 mmol/L (ref 20–29)
Calcium: 9.4 mg/dL (ref 8.6–10.2)
Chloride: 101 mmol/L (ref 96–106)
Creatinine, Ser: 1.11 mg/dL (ref 0.76–1.27)
Globulin, Total: 2.2 g/dL (ref 1.5–4.5)
Glucose: 102 mg/dL — ABNORMAL HIGH (ref 70–99)
Potassium: 4.4 mmol/L (ref 3.5–5.2)
Sodium: 137 mmol/L (ref 134–144)
Total Protein: 6.7 g/dL (ref 6.0–8.5)
eGFR: 72 mL/min/1.73 (ref >59)

## 2023-12-15 LAB — PSA: Prostate Specific Ag, Serum: 1.5 ng/mL (ref 0.0–4.0)

## 2023-12-15 LAB — HEMOGLOBIN A1C
Est. average glucose Bld gHb Est-mCnc: 117 mg/dL
Hgb A1c MFr Bld: 5.7 % — ABNORMAL HIGH (ref 4.8–5.6)

## 2023-12-15 LAB — VITAMIN D 25 HYDROXY (VIT D DEFICIENCY, FRACTURES): Vit D, 25-Hydroxy: 101 ng/mL — ABNORMAL HIGH (ref 30.0–100.0)

## 2023-12-15 LAB — URIC ACID: Uric Acid: 8.8 mg/dL — ABNORMAL HIGH (ref 3.8–8.4)

## 2023-12-16 ENCOUNTER — Ambulatory Visit (INDEPENDENT_AMBULATORY_CARE_PROVIDER_SITE_OTHER): Admitting: Internal Medicine

## 2023-12-16 ENCOUNTER — Encounter: Payer: Self-pay | Admitting: Internal Medicine

## 2023-12-16 VITALS — BP 117/76 | HR 66 | Ht 71.0 in | Wt 234.0 lb

## 2023-12-16 DIAGNOSIS — Z87442 Personal history of urinary calculi: Secondary | ICD-10-CM | POA: Insufficient documentation

## 2023-12-16 DIAGNOSIS — I10 Essential (primary) hypertension: Secondary | ICD-10-CM

## 2023-12-16 DIAGNOSIS — E782 Mixed hyperlipidemia: Secondary | ICD-10-CM

## 2023-12-16 DIAGNOSIS — Z0001 Encounter for general adult medical examination with abnormal findings: Secondary | ICD-10-CM

## 2023-12-16 DIAGNOSIS — M1A0710 Idiopathic chronic gout, right ankle and foot, without tophus (tophi): Secondary | ICD-10-CM | POA: Diagnosis not present

## 2023-12-16 MED ORDER — ATORVASTATIN CALCIUM 10 MG PO TABS: 10.0000 mg | ORAL_TABLET | Freq: Every day | ORAL | 3 refills | Status: AC

## 2023-12-16 MED ORDER — METOPROLOL TARTRATE 25 MG PO TABS: ORAL_TABLET | ORAL | 3 refills | Status: AC

## 2023-12-16 NOTE — Patient Instructions (Addendum)
 Please start taking Atorvastatin  10 mg once daily, in the evening. Please start taking CoQ10 - 100 mg once daily.  Please continue to take medications as prescribed.  Please continue to follow low carb diet and perform moderate exercise/walking at least 150 mins/week.  Please get fasting blood tests done after 3 months.

## 2023-12-16 NOTE — Assessment & Plan Note (Signed)
Physical exam as documented. Fasting blood tests reviewed today. Advised to get Shingrix and Tdap vaccines at local pharmacy.  Denies pneumococcal and flu vaccines.

## 2023-12-16 NOTE — Progress Notes (Unsigned)
 Established Patient Office Visit  Subjective:  Patient ID: Phillip Mccall, male    DOB: [birth year]  Age: 68 y.o. MRN: [id number]  CC:  Chief Complaint  Patient presents with   Annual Exam    HPI Phillip Mccall is a 68 y.o. male with past medical history of HTN, gout and OA who presents for annual physical.  Gout: Denies any recent flareup. His gout flareup resolves with colchicine .  His uric acid level was elevated, but he prefers to avoid taking allopurinol .  He reports that he had high protein diet before gout flareup, and has been trying to modify his diet now.  HLD: His cholesterol was elevated, but he states that he had increased fatty food content in his diet.  He has lost about 60 lbs since 02/22 with low-carb diet and another 14 lbs since 04/24.  I had lengthy discussion of QD persistently elevated LDL and importance of proper treatment.  After considering benefits and risks, he agrees to start statin at a lower dose.     Past Medical History:  Diagnosis Date   Hx of adenomatous polyp of colon 10/29/2021   Single diminutive polyp recall 20 33   Hypertension    Renal disorder    Kidney Stones   Umbilical hernia     Past Surgical History:  Procedure Laterality Date   COLONOSCOPY  03/20/1996   Repeat July 2023   HERNIA REPAIR  04/20/1997    Family History  Problem Relation Age of Onset   Colon polyps Father    Colon cancer Neg Hx    Esophageal cancer Neg Hx    Stomach cancer Neg Hx    Rectal cancer Neg Hx     Social History   Socioeconomic History   Marital status: Married    Spouse name: Not on file   Number of children: Not on file   Years of education: Not on file   Highest education level: Not on file  Occupational History   Not on file  Tobacco Use   Smoking status: Some Days    Types: Cigarettes, Cigars   Smokeless tobacco: Not on file  Vaping Use   Vaping status: Never Used  Substance and Sexual Activity   Alcohol use: Not Currently     Comment: occ   Drug use: Not Currently   Sexual activity: Not on file  Other Topics Concern   Not on file  Social History Narrative   Not on file   Social Drivers of Health   Financial Resource Strain: Not on file  Food Insecurity: Not on file  Transportation Needs: Not on file  Physical Activity: Not on file  Stress: Not on file  Social Connections: Not on file  Intimate Partner Violence: Not on file    Outpatient Medications Prior to Visit  Medication Sig Dispense Refill   allopurinol  (ZYLOPRIM ) 100 MG tablet Take 1 tablet (100 mg total) by mouth daily. 30 tablet 3   Bioflavonoid Products (QUERCETIN COMPLEX IMMUNE PO) Take by mouth.     Cholecalciferol (VITAMIN D3 PO) Take by mouth.     colchicine  0.6 MG tablet Take 2 tablets by mouth today, followed by 1 tablet 1 hour later, then 1 tablet once daily. 20 tablet 1   Multiple Vitamins-Minerals (CENTRUM SILVER ADULT 50+) TABS Take 1 tablet by mouth every morning.     Multiple Vitamins-Minerals (ZINC PO) Take by mouth.     naproxen  (NAPROSYN ) 500 MG tablet Take 1 tablet (500  mg total) by mouth 2 (two) times daily with a meal. 30 tablet 2   Turmeric (QC TUMERIC COMPLEX PO) Take by mouth.     metoprolol  tartrate (LOPRESSOR ) 25 MG tablet TAKE 1 TABLET BY MOUTH IN THE MORNING, NOON, AND AT BEDTIME 270 tablet 0   No facility-administered medications prior to visit.    Allergies  Allergen Reactions   Sulfonamide Derivatives Hives and Rash    REACTION: hives/rash    ROS Review of Systems  Constitutional:  Negative for chills and fever.  HENT:  Negative for congestion and sore throat.   Eyes:  Negative for pain and discharge.  Respiratory:  Negative for cough and shortness of breath.   Cardiovascular:  Negative for chest pain and palpitations.  Gastrointestinal:  Negative for diarrhea, nausea and vomiting.  Endocrine: Negative for polydipsia and polyuria.  Genitourinary:  Negative for dysuria and hematuria.  Musculoskeletal:   Positive for arthralgias. Negative for neck pain and neck stiffness.  Skin:  Negative for rash.  Neurological:  Negative for dizziness, weakness, numbness and headaches.  Psychiatric/Behavioral:  Negative for agitation and behavioral problems.       Objective:    Physical Exam Vitals reviewed.  Constitutional:      General: He is not in acute distress.    Appearance: He is obese. He is not diaphoretic.  HENT:     Head: Normocephalic and atraumatic.     Nose: Nose normal.     Mouth/Throat:     Mouth: Mucous membranes are moist.  Eyes:     General: No scleral icterus.    Extraocular Movements: Extraocular movements intact.  Cardiovascular:     Rate and Rhythm: Normal rate and regular rhythm.     Heart sounds: Normal heart sounds. No murmur heard. Pulmonary:     Breath sounds: Normal breath sounds. No wheezing or rales.  Abdominal:     Palpations: Abdomen is soft.     Tenderness: There is no abdominal tenderness.  Musculoskeletal:     Cervical back: Neck supple. No tenderness.     Right lower leg: No edema.     Left lower leg: No edema.  Skin:    General: Skin is warm.     Findings: No rash.  Neurological:     General: No focal deficit present.     Mental Status: He is alert and oriented to person, place, and time.     Cranial Nerves: No cranial nerve deficit.     Sensory: No sensory deficit.     Motor: No weakness.  Psychiatric:        Mood and Affect: Mood normal.        Behavior: Behavior normal.     BP 117/76 (BP Location: Left Arm) Comment: At home  Pulse 66   Ht 5' 11 (1.803 m)   Wt 234 lb (106.1 kg)   SpO2 98%   BMI 32.64 kg/m  Wt Readings from Last 3 Encounters:  12/16/23 234 lb (106.1 kg)  12/15/22 234 lb 3.2 oz (106.2 kg)  08/11/22 246 lb (111.6 kg)    Lab Results  Component Value Date   TSH 2.000 12/14/2023   Lab Results  Component Value Date   WBC 6.1 12/14/2023   HGB 15.5 12/14/2023   HCT 48.0 12/14/2023   MCV 90 12/14/2023   PLT 276  12/14/2023   Lab Results  Component Value Date   NA 137 12/14/2023   K 4.4 12/14/2023   CO2 22 12/14/2023  GLUCOSE 102 (H) 12/14/2023   BUN 23 12/14/2023   CREATININE 1.11 12/14/2023   BILITOT 0.5 12/14/2023   ALKPHOS 69 12/14/2023   AST 19 12/14/2023   ALT 26 12/14/2023   PROT 6.7 12/14/2023   ALBUMIN 4.5 12/14/2023   CALCIUM  9.4 12/14/2023   EGFR 72 12/14/2023   Lab Results  Component Value Date   CHOL 251 (H) 12/14/2023   Lab Results  Component Value Date   HDL 41 12/14/2023   Lab Results  Component Value Date   LDLCALC 197 (H) 12/14/2023   Lab Results  Component Value Date   TRIG 77 12/14/2023   Lab Results  Component Value Date   CHOLHDL 6.1 (H) 12/14/2023   Lab Results  Component Value Date   HGBA1C 5.7 (H) 12/14/2023      Assessment & Plan:   Problem List Items Addressed This Visit       Cardiovascular and Mediastinum   Essential hypertension   BP Readings from Last 1 Encounters:  12/16/23 117/76   Elevated today, 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 Well-controlled with metoprolol  at home, also takes it for situational anxiety Counseled for compliance with the medications Advised DASH diet and moderate exercise/walking, at least 150 mins/week      Relevant Medications   metoprolol  tartrate (LOPRESSOR ) 25 MG tablet   atorvastatin  (LIPITOR) 10 MG tablet   Other Relevant Orders   CMP14+EGFR     Other   Chronic gout without tophus   Checked uric acid level Have advised to start taking allopurinol  regularly, but he does not agree - prefers to manage with diet alone Has colchicine  for acute flareup      Encounter for general adult medical examination with abnormal findings - Primary   Physical exam as documented. Fasting blood tests reviewed today. Advised to get Shingrix and Tdap vaccines at local pharmacy.  Denies pneumococcal and flu vaccines.      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 Advised to maintain at least 64 ounces of fluid intake in a day and follow low-salt diet        Meds ordered this encounter  Medications   metoprolol  tartrate (LOPRESSOR ) 25 MG tablet    Sig: TAKE 1 TABLET BY MOUTH IN THE MORNING, NOON, AND AT BEDTIME    Dispense:  270 tablet    Refill:  3   atorvastatin  (LIPITOR) 10 MG tablet    Sig: Take 1 tablet (10 mg total) by mouth daily.    Dispense:  90 tablet    Refill:  3    Follow-up: Return in about 6 months (around 06/17/2024) for HTN and HLD.    Suzzane MARLA Blanch, MD

## 2023-12-16 NOTE — Assessment & Plan Note (Signed)
Checked uric acid level Have advised to start taking allopurinol regularly, but he does not agree - prefers to manage with diet alone Has colchicine for acute flareup

## 2023-12-17 NOTE — Assessment & Plan Note (Signed)
 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

## 2023-12-17 NOTE — Assessment & Plan Note (Signed)
 Reports history of recurrent nephrolithiasis, although has not required any intervention. Advised to maintain at least 64 ounces of fluid intake in a day and follow low-salt diet

## 2023-12-17 NOTE — Assessment & Plan Note (Signed)
 Lipid profile reviewed Advised to start statin - understands higher risk of CVA and CAD at LDL > 180, started atorvastatin  10 mg QD, may need higher dose, but he is hesitant of any statin Advised to take co-Q10 - 100 mg QD for leg cramps

## 2023-12-18 ENCOUNTER — Other Ambulatory Visit: Payer: Self-pay | Admitting: Internal Medicine

## 2023-12-18 DIAGNOSIS — M150 Primary generalized (osteo)arthritis: Secondary | ICD-10-CM

## 2024-02-09 ENCOUNTER — Other Ambulatory Visit: Payer: Self-pay | Admitting: Internal Medicine

## 2024-02-09 DIAGNOSIS — M150 Primary generalized (osteo)arthritis: Secondary | ICD-10-CM

## 2024-02-09 MED ORDER — NAPROXEN 500 MG PO TABS: 500.0000 mg | ORAL_TABLET | Freq: Two times a day (BID) | ORAL | 2 refills | Status: AC

## 2024-02-09 NOTE — Telephone Encounter (Signed)
 Copied from CRM #[id number]. Topic: Clinical - Medication Refill >> Feb 09, 2024 10:52 AM Charlet HERO wrote: Medication: naproxen  (NAPROSYN ) 500 MG tablet  Has the patient contacted their pharmacy? No 0 refills  This is the patient's preferred pharmacy:  [facility] [phone] - [place], [place] - [address] [facility] [address] [place] [address] Phone: [phone] Fax: [phone]    Is this the correct pharmacy for this prescription? Yes If no, delete pharmacy and type the correct one.   Has the prescription been filled recently? Yes  Is the patient out of the medication? Yes  Has the patient been seen for an appointment in the last year OR does the patient have an upcoming appointment? Yes  Can we respond through MyChart? Yes  Agent: Please be advised that Rx refills may take up to 3 business days. We ask that you follow-up with your pharmacy.

## 2024-06-15 ENCOUNTER — Ambulatory Visit: Admitting: Internal Medicine
